# Patient Record
Sex: Male | Born: 1955 | Race: White | Hispanic: No | Marital: Single | Smoking: Former smoker
Health system: Southern US, Community
[De-identification: ages and names within clinical notes are randomized; demographics above are authoritative.]

## PROBLEM LIST (undated history)

## (undated) DIAGNOSIS — I700 Atherosclerosis of aorta: Secondary | ICD-10-CM

## (undated) DIAGNOSIS — I10 Essential (primary) hypertension: Secondary | ICD-10-CM

## (undated) DIAGNOSIS — F1010 Alcohol abuse, uncomplicated: Secondary | ICD-10-CM

## (undated) DIAGNOSIS — M459 Ankylosing spondylitis of unspecified sites in spine: Secondary | ICD-10-CM

## (undated) DIAGNOSIS — I2510 Atherosclerotic heart disease of native coronary artery without angina pectoris: Secondary | ICD-10-CM

## (undated) DIAGNOSIS — N184 Chronic kidney disease, stage 4 (severe): Secondary | ICD-10-CM

## (undated) DIAGNOSIS — D539 Nutritional anemia, unspecified: Secondary | ICD-10-CM

## (undated) HISTORY — PX: TOTAL HIP ARTHROPLASTY: SHX124

## (undated) HISTORY — PX: BACK SURGERY: SHX140

---

## 2019-10-28 ENCOUNTER — Other Ambulatory Visit: Payer: Self-pay

## 2019-10-28 ENCOUNTER — Encounter: Payer: Self-pay | Admitting: Emergency Medicine

## 2019-10-28 DIAGNOSIS — Z96643 Presence of artificial hip joint, bilateral: Secondary | ICD-10-CM | POA: Diagnosis present

## 2019-10-28 DIAGNOSIS — B1920 Unspecified viral hepatitis C without hepatic coma: Secondary | ICD-10-CM | POA: Diagnosis present

## 2019-10-28 DIAGNOSIS — K852 Alcohol induced acute pancreatitis without necrosis or infection: Principal | ICD-10-CM | POA: Diagnosis present

## 2019-10-28 DIAGNOSIS — E669 Obesity, unspecified: Secondary | ICD-10-CM | POA: Diagnosis present

## 2019-10-28 DIAGNOSIS — R1110 Vomiting, unspecified: Secondary | ICD-10-CM | POA: Diagnosis present

## 2019-10-28 DIAGNOSIS — F17210 Nicotine dependence, cigarettes, uncomplicated: Secondary | ICD-10-CM | POA: Diagnosis present

## 2019-10-28 DIAGNOSIS — Z7982 Long term (current) use of aspirin: Secondary | ICD-10-CM

## 2019-10-28 DIAGNOSIS — K8020 Calculus of gallbladder without cholecystitis without obstruction: Secondary | ICD-10-CM | POA: Diagnosis not present

## 2019-10-28 DIAGNOSIS — Z888 Allergy status to other drugs, medicaments and biological substances status: Secondary | ICD-10-CM

## 2019-10-28 DIAGNOSIS — M459 Ankylosing spondylitis of unspecified sites in spine: Secondary | ICD-10-CM | POA: Diagnosis present

## 2019-10-28 DIAGNOSIS — R000 Tachycardia, unspecified: Secondary | ICD-10-CM | POA: Diagnosis present

## 2019-10-28 DIAGNOSIS — Z833 Family history of diabetes mellitus: Secondary | ICD-10-CM

## 2019-10-28 DIAGNOSIS — I10 Essential (primary) hypertension: Secondary | ICD-10-CM | POA: Diagnosis present

## 2019-10-28 DIAGNOSIS — D696 Thrombocytopenia, unspecified: Secondary | ICD-10-CM | POA: Diagnosis present

## 2019-10-28 DIAGNOSIS — Z79891 Long term (current) use of opiate analgesic: Secondary | ICD-10-CM

## 2019-10-28 DIAGNOSIS — I160 Hypertensive urgency: Secondary | ICD-10-CM | POA: Diagnosis present

## 2019-10-28 DIAGNOSIS — Z20828 Contact with and (suspected) exposure to other viral communicable diseases: Secondary | ICD-10-CM | POA: Diagnosis present

## 2019-10-28 DIAGNOSIS — F1020 Alcohol dependence, uncomplicated: Secondary | ICD-10-CM | POA: Diagnosis present

## 2019-10-28 DIAGNOSIS — Z6832 Body mass index (BMI) 32.0-32.9, adult: Secondary | ICD-10-CM

## 2019-10-28 DIAGNOSIS — R739 Hyperglycemia, unspecified: Secondary | ICD-10-CM | POA: Diagnosis present

## 2019-10-28 DIAGNOSIS — N179 Acute kidney failure, unspecified: Secondary | ICD-10-CM | POA: Diagnosis present

## 2019-10-28 DIAGNOSIS — Z79899 Other long term (current) drug therapy: Secondary | ICD-10-CM

## 2019-10-28 LAB — URINALYSIS, COMPLETE (UACMP) WITH MICROSCOPIC
Bacteria, UA: NONE SEEN
Bilirubin Urine: NEGATIVE
Glucose, UA: 50 mg/dL — AB
Hgb urine dipstick: NEGATIVE
Ketones, ur: 5 mg/dL — AB
Leukocytes,Ua: NEGATIVE
Nitrite: NEGATIVE
Protein, ur: >=300 mg/dL — AB
Specific Gravity, Urine: 1.023 (ref 1.005–1.030)
pH: 6 (ref 5.0–8.0)

## 2019-10-28 LAB — CBC WITH DIFFERENTIAL/PLATELET
Abs Immature Granulocytes: 0.1 10*3/uL — ABNORMAL HIGH (ref 0.00–0.07)
Basophils Absolute: 0 10*3/uL (ref 0.0–0.1)
Basophils Relative: 0 %
Eosinophils Absolute: 0 10*3/uL (ref 0.0–0.5)
Eosinophils Relative: 0 %
HCT: 45.1 % (ref 39.0–52.0)
Hemoglobin: 15.4 g/dL (ref 13.0–17.0)
Immature Granulocytes: 1 %
Lymphocytes Relative: 2 %
Lymphs Abs: 0.3 10*3/uL — ABNORMAL LOW (ref 0.7–4.0)
MCH: 33.7 pg (ref 26.0–34.0)
MCHC: 34.1 g/dL (ref 30.0–36.0)
MCV: 98.7 fL (ref 80.0–100.0)
Monocytes Absolute: 0.6 10*3/uL (ref 0.1–1.0)
Monocytes Relative: 4 %
Neutro Abs: 16.2 10*3/uL — ABNORMAL HIGH (ref 1.7–7.7)
Neutrophils Relative %: 93 %
Platelets: 140 10*3/uL — ABNORMAL LOW (ref 150–400)
RBC: 4.57 MIL/uL (ref 4.22–5.81)
RDW: 13.3 % (ref 11.5–15.5)
WBC: 17.3 10*3/uL — ABNORMAL HIGH (ref 4.0–10.5)
nRBC: 0 % (ref 0.0–0.2)

## 2019-10-28 NOTE — ED Triage Notes (Signed)
Pt to triage via w/c, mask in place with no distress noted; pt reports generalized abd pain accomp by N/V; st broke out in sweats earlier; denies hx of same

## 2019-10-29 ENCOUNTER — Inpatient Hospital Stay: Payer: Medicare Other

## 2019-10-29 ENCOUNTER — Inpatient Hospital Stay
Admission: EM | Admit: 2019-10-29 | Discharge: 2019-10-31 | DRG: 439 | Disposition: A | Discharge status: Home / Self Care | Payer: Medicare Other | Attending: Internal Medicine | Admitting: Internal Medicine

## 2019-10-29 ENCOUNTER — Emergency Department: Payer: Medicare Other

## 2019-10-29 ENCOUNTER — Encounter: Payer: Self-pay | Admitting: Emergency Medicine

## 2019-10-29 DIAGNOSIS — K859 Acute pancreatitis without necrosis or infection, unspecified: Secondary | ICD-10-CM | POA: Diagnosis present

## 2019-10-29 DIAGNOSIS — F1010 Alcohol abuse, uncomplicated: Secondary | ICD-10-CM | POA: Diagnosis present

## 2019-10-29 DIAGNOSIS — M459 Ankylosing spondylitis of unspecified sites in spine: Secondary | ICD-10-CM | POA: Diagnosis present

## 2019-10-29 DIAGNOSIS — F10288 Alcohol dependence with other alcohol-induced disorder: Secondary | ICD-10-CM | POA: Diagnosis not present

## 2019-10-29 DIAGNOSIS — Z6832 Body mass index (BMI) 32.0-32.9, adult: Secondary | ICD-10-CM | POA: Diagnosis not present

## 2019-10-29 DIAGNOSIS — E669 Obesity, unspecified: Secondary | ICD-10-CM | POA: Diagnosis present

## 2019-10-29 DIAGNOSIS — I10 Essential (primary) hypertension: Secondary | ICD-10-CM | POA: Diagnosis not present

## 2019-10-29 DIAGNOSIS — Z96643 Presence of artificial hip joint, bilateral: Secondary | ICD-10-CM | POA: Diagnosis present

## 2019-10-29 DIAGNOSIS — R000 Tachycardia, unspecified: Secondary | ICD-10-CM | POA: Diagnosis present

## 2019-10-29 DIAGNOSIS — Z79899 Other long term (current) drug therapy: Secondary | ICD-10-CM | POA: Diagnosis not present

## 2019-10-29 DIAGNOSIS — F1020 Alcohol dependence, uncomplicated: Secondary | ICD-10-CM | POA: Diagnosis present

## 2019-10-29 DIAGNOSIS — K8020 Calculus of gallbladder without cholecystitis without obstruction: Secondary | ICD-10-CM | POA: Diagnosis present

## 2019-10-29 DIAGNOSIS — I160 Hypertensive urgency: Secondary | ICD-10-CM | POA: Diagnosis present

## 2019-10-29 DIAGNOSIS — D696 Thrombocytopenia, unspecified: Secondary | ICD-10-CM | POA: Diagnosis present

## 2019-10-29 DIAGNOSIS — Z20828 Contact with and (suspected) exposure to other viral communicable diseases: Secondary | ICD-10-CM | POA: Diagnosis present

## 2019-10-29 DIAGNOSIS — R739 Hyperglycemia, unspecified: Secondary | ICD-10-CM | POA: Diagnosis present

## 2019-10-29 DIAGNOSIS — Z720 Tobacco use: Secondary | ICD-10-CM | POA: Diagnosis present

## 2019-10-29 DIAGNOSIS — Z833 Family history of diabetes mellitus: Secondary | ICD-10-CM | POA: Diagnosis not present

## 2019-10-29 DIAGNOSIS — N179 Acute kidney failure, unspecified: Secondary | ICD-10-CM | POA: Diagnosis present

## 2019-10-29 DIAGNOSIS — Z7982 Long term (current) use of aspirin: Secondary | ICD-10-CM | POA: Diagnosis not present

## 2019-10-29 DIAGNOSIS — Z888 Allergy status to other drugs, medicaments and biological substances status: Secondary | ICD-10-CM | POA: Diagnosis not present

## 2019-10-29 DIAGNOSIS — K852 Alcohol induced acute pancreatitis without necrosis or infection: Secondary | ICD-10-CM

## 2019-10-29 DIAGNOSIS — Z79891 Long term (current) use of opiate analgesic: Secondary | ICD-10-CM | POA: Diagnosis not present

## 2019-10-29 DIAGNOSIS — B1920 Unspecified viral hepatitis C without hepatic coma: Secondary | ICD-10-CM | POA: Diagnosis present

## 2019-10-29 DIAGNOSIS — F17210 Nicotine dependence, cigarettes, uncomplicated: Secondary | ICD-10-CM | POA: Diagnosis present

## 2019-10-29 DIAGNOSIS — R1110 Vomiting, unspecified: Secondary | ICD-10-CM | POA: Diagnosis present

## 2019-10-29 HISTORY — DX: Essential (primary) hypertension: I10

## 2019-10-29 HISTORY — DX: Alcohol abuse, uncomplicated: F10.10

## 2019-10-29 HISTORY — DX: Ankylosing spondylitis of unspecified sites in spine: M45.9

## 2019-10-29 LAB — HEPATIC FUNCTION PANEL
ALT: 20 U/L (ref 0–44)
AST: 36 U/L (ref 15–41)
Albumin: 3.4 g/dL — ABNORMAL LOW (ref 3.5–5.0)
Alkaline Phosphatase: 76 U/L (ref 38–126)
Bilirubin, Direct: 0.2 mg/dL (ref 0.0–0.2)
Indirect Bilirubin: 0.7 mg/dL (ref 0.3–0.9)
Total Bilirubin: 0.9 mg/dL (ref 0.3–1.2)
Total Protein: 7.2 g/dL (ref 6.5–8.1)

## 2019-10-29 LAB — BASIC METABOLIC PANEL
Anion gap: 9 (ref 5–15)
BUN: 15 mg/dL (ref 8–23)
CO2: 23 mmol/L (ref 22–32)
Calcium: 8.4 mg/dL — ABNORMAL LOW (ref 8.9–10.3)
Chloride: 105 mmol/L (ref 98–111)
Creatinine, Ser: 1.08 mg/dL (ref 0.61–1.24)
GFR calc Af Amer: >60 mL/min (ref >60)
GFR calc non Af Amer: >60 mL/min (ref >60)
Glucose, Bld: 180 mg/dL — ABNORMAL HIGH (ref 70–99)
Potassium: 4.1 mmol/L (ref 3.5–5.1)
Sodium: 137 mmol/L (ref 135–145)

## 2019-10-29 LAB — COMPREHENSIVE METABOLIC PANEL
ALT: 22 U/L (ref 0–44)
AST: 35 U/L (ref 15–41)
Albumin: 3.7 g/dL (ref 3.5–5.0)
Alkaline Phosphatase: 83 U/L (ref 38–126)
Anion gap: 14 (ref 5–15)
BUN: 15 mg/dL (ref 8–23)
CO2: 20 mmol/L — ABNORMAL LOW (ref 22–32)
Calcium: 9.1 mg/dL (ref 8.9–10.3)
Chloride: 103 mmol/L (ref 98–111)
Creatinine, Ser: 1.15 mg/dL (ref 0.61–1.24)
GFR calc Af Amer: >60 mL/min (ref >60)
GFR calc non Af Amer: >60 mL/min (ref >60)
Glucose, Bld: 183 mg/dL — ABNORMAL HIGH (ref 70–99)
Potassium: 4.2 mmol/L (ref 3.5–5.1)
Sodium: 137 mmol/L (ref 135–145)
Total Bilirubin: 0.8 mg/dL (ref 0.3–1.2)
Total Protein: 7.7 g/dL (ref 6.5–8.1)

## 2019-10-29 LAB — CBC WITH DIFFERENTIAL/PLATELET
Abs Immature Granulocytes: 0.1 10*3/uL — ABNORMAL HIGH (ref 0.00–0.07)
Basophils Absolute: 0 10*3/uL (ref 0.0–0.1)
Basophils Relative: 0 %
Eosinophils Absolute: 0 10*3/uL (ref 0.0–0.5)
Eosinophils Relative: 0 %
HCT: 42.6 % (ref 39.0–52.0)
Hemoglobin: 15.3 g/dL (ref 13.0–17.0)
Immature Granulocytes: 1 %
Lymphocytes Relative: 1 %
Lymphs Abs: 0.2 10*3/uL — ABNORMAL LOW (ref 0.7–4.0)
MCH: 34.5 pg — ABNORMAL HIGH (ref 26.0–34.0)
MCHC: 35.9 g/dL (ref 30.0–36.0)
MCV: 95.9 fL (ref 80.0–100.0)
Monocytes Absolute: 1 10*3/uL (ref 0.1–1.0)
Monocytes Relative: 5 %
Neutro Abs: 17 10*3/uL — ABNORMAL HIGH (ref 1.7–7.7)
Neutrophils Relative %: 93 %
Platelets: 128 10*3/uL — ABNORMAL LOW (ref 150–400)
RBC: 4.44 MIL/uL (ref 4.22–5.81)
RDW: 13 % (ref 11.5–15.5)
WBC: 18.3 10*3/uL — ABNORMAL HIGH (ref 4.0–10.5)
nRBC: 0 % (ref 0.0–0.2)

## 2019-10-29 LAB — TROPONIN I (HIGH SENSITIVITY)
Troponin I (High Sensitivity): 14 ng/L (ref <18)
Troponin I (High Sensitivity): 30 ng/L — ABNORMAL HIGH (ref <18)
Troponin I (High Sensitivity): 33 ng/L — ABNORMAL HIGH (ref <18)

## 2019-10-29 LAB — GLUCOSE, CAPILLARY
Glucose-Capillary: 161 mg/dL — ABNORMAL HIGH (ref 70–99)
Glucose-Capillary: 145 mg/dL — ABNORMAL HIGH (ref 70–99)
Glucose-Capillary: 134 mg/dL — ABNORMAL HIGH (ref 70–99)

## 2019-10-29 LAB — ETHANOL: Alcohol, Ethyl (B): <10 mg/dL (ref <10)

## 2019-10-29 LAB — SARS CORONAVIRUS 2 (TAT 6-24 HRS): SARS Coronavirus 2: NEGATIVE

## 2019-10-29 LAB — LIPASE, BLOOD: Lipase: 1454 U/L — ABNORMAL HIGH (ref 11–51)

## 2019-10-29 MED ORDER — ACETAMINOPHEN 650 MG RE SUPP: 650.0000 mg | Freq: Four times a day (QID) | RECTAL | Status: DC | PRN

## 2019-10-29 MED ORDER — LABETALOL HCL 5 MG/ML IV SOLN
10.0000 mg | INTRAVENOUS | Status: DC | PRN
  Administered 2019-10-29 – 2019-10-30 (×4): 10 mg via INTRAVENOUS
  Filled 2019-10-29 (×6): qty 4

## 2019-10-29 MED ORDER — ADULT MULTIVITAMIN W/MINERALS CH
1.0000 | ORAL_TABLET | Freq: Every day | ORAL | Status: DC
  Administered 2019-10-29 – 2019-10-31 (×3): 1 via ORAL
  Filled 2019-10-29 (×3): qty 1

## 2019-10-29 MED ORDER — LACTATED RINGERS IV SOLN
INTRAVENOUS | Status: AC
  Administered 2019-10-29 (×2): via INTRAVENOUS

## 2019-10-29 MED ORDER — ONDANSETRON HCL 4 MG/2ML IJ SOLN
4.0000 mg | Freq: Four times a day (QID) | INTRAMUSCULAR | Status: DC | PRN
  Administered 2019-10-29: 4 mg via INTRAVENOUS
  Filled 2019-10-29: qty 2

## 2019-10-29 MED ORDER — LORAZEPAM 2 MG/ML IJ SOLN
1.0000 mg | INTRAMUSCULAR | Status: DC | PRN
  Administered 2019-10-31: 1 mg via INTRAVENOUS
  Filled 2019-10-29: qty 1

## 2019-10-29 MED ORDER — LORAZEPAM 2 MG PO TABS: 0.0000 mg | ORAL_TABLET | Freq: Two times a day (BID) | ORAL | Status: DC

## 2019-10-29 MED ORDER — LORAZEPAM 2 MG PO TABS: 0.0000 mg | ORAL_TABLET | Freq: Four times a day (QID) | ORAL | Status: DC

## 2019-10-29 MED ORDER — ONDANSETRON HCL 4 MG PO TABS: 4.0000 mg | ORAL_TABLET | Freq: Four times a day (QID) | ORAL | Status: DC | PRN

## 2019-10-29 MED ORDER — LORAZEPAM 2 MG/ML IJ SOLN: 0.0000 mg | Freq: Two times a day (BID) | INTRAMUSCULAR | Status: DC

## 2019-10-29 MED ORDER — LORAZEPAM 2 MG/ML IJ SOLN: 0.0000 mg | Freq: Four times a day (QID) | INTRAMUSCULAR | Status: AC

## 2019-10-29 MED ORDER — SODIUM CHLORIDE 0.9 % IV BOLUS
1000.0000 mL | Freq: Once | INTRAVENOUS | Status: AC
  Administered 2019-10-29: 1000 mL via INTRAVENOUS

## 2019-10-29 MED ORDER — LORAZEPAM 1 MG PO TABS
1.0000 mg | ORAL_TABLET | ORAL | Status: DC | PRN
  Administered 2019-10-29: 1 mg via ORAL
  Filled 2019-10-29: qty 1

## 2019-10-29 MED ORDER — SULFASALAZINE 500 MG PO TBEC
500.0000 mg | DELAYED_RELEASE_TABLET | Freq: Two times a day (BID) | ORAL | Status: DC
  Administered 2019-10-29 – 2019-10-31 (×5): 500 mg via ORAL
  Filled 2019-10-29 (×7): qty 1

## 2019-10-29 MED ORDER — IOHEXOL 300 MG/ML  SOLN
100.0000 mL | Freq: Once | INTRAMUSCULAR | Status: AC | PRN
  Administered 2019-10-29: 100 mL via INTRAVENOUS

## 2019-10-29 MED ORDER — LORAZEPAM 2 MG/ML IJ SOLN: 0.0000 mg | Freq: Four times a day (QID) | INTRAMUSCULAR | Status: DC

## 2019-10-29 MED ORDER — ONDANSETRON HCL 4 MG/2ML IJ SOLN
4.0000 mg | INTRAMUSCULAR | Status: AC
  Administered 2019-10-29: 4 mg via INTRAVENOUS
  Filled 2019-10-29: qty 2

## 2019-10-29 MED ORDER — HYDROMORPHONE HCL 1 MG/ML IJ SOLN
1.0000 mg | INTRAMUSCULAR | Status: AC
  Administered 2019-10-29: 1 mg via INTRAVENOUS
  Filled 2019-10-29: qty 1

## 2019-10-29 MED ORDER — HYDROMORPHONE HCL 1 MG/ML IJ SOLN
1.0000 mg | INTRAMUSCULAR | Status: DC | PRN
  Administered 2019-10-29 – 2019-10-30 (×8): 1 mg via INTRAVENOUS
  Filled 2019-10-29 (×8): qty 1

## 2019-10-29 MED ORDER — FOLIC ACID 1 MG PO TABS
1.0000 mg | ORAL_TABLET | Freq: Every day | ORAL | Status: DC
  Administered 2019-10-29 – 2019-10-31 (×3): 1 mg via ORAL
  Filled 2019-10-29 (×3): qty 1

## 2019-10-29 MED ORDER — ACETAMINOPHEN 325 MG PO TABS: 650.0000 mg | ORAL_TABLET | Freq: Four times a day (QID) | ORAL | Status: DC | PRN

## 2019-10-29 MED ORDER — NICOTINE 14 MG/24HR TD PT24
14.0000 mg | MEDICATED_PATCH | Freq: Every day | TRANSDERMAL | Status: DC
  Administered 2019-10-29 – 2019-10-31 (×3): 14 mg via TRANSDERMAL
  Filled 2019-10-29 (×3): qty 1

## 2019-10-29 MED ORDER — LABETALOL HCL 5 MG/ML IV SOLN
10.0000 mg | Freq: Once | INTRAVENOUS | Status: AC
  Administered 2019-10-29: 10 mg via INTRAVENOUS

## 2019-10-29 MED ORDER — HEPARIN SODIUM (PORCINE) 5000 UNIT/ML IJ SOLN
5000.0000 [IU] | Freq: Three times a day (TID) | INTRAMUSCULAR | Status: DC
  Administered 2019-10-29 – 2019-10-31 (×7): 5000 [IU] via SUBCUTANEOUS
  Filled 2019-10-29 (×8): qty 1

## 2019-10-29 MED ORDER — MORPHINE SULFATE (PF) 4 MG/ML IV SOLN: 4.0000 mg | Freq: Once | INTRAVENOUS | Status: DC

## 2019-10-29 MED ORDER — THIAMINE HCL 100 MG/ML IJ SOLN
100.0000 mg | Freq: Every day | INTRAMUSCULAR | Status: DC
  Administered 2019-10-29: 100 mg via INTRAVENOUS
  Filled 2019-10-29: qty 2

## 2019-10-29 MED ORDER — VITAMIN B-1 100 MG PO TABS
100.0000 mg | ORAL_TABLET | Freq: Every day | ORAL | Status: DC
  Administered 2019-10-29 – 2019-10-31 (×3): 100 mg via ORAL
  Filled 2019-10-29 (×3): qty 1

## 2019-10-29 MED ORDER — THIAMINE HCL 100 MG/ML IJ SOLN
100.0000 mg | Freq: Every day | INTRAMUSCULAR | Status: DC
  Filled 2019-10-29: qty 2

## 2019-10-29 NOTE — H&P (Signed)
History and Physical    Icarus Partch Quilter IOE:[id number] DOB: April 07, 1956 DOA: 10/29/2019  PCP: System, Pcp Not In  Patient coming from: Home.  Chief Complaint: Abdominal pain.  HPI: Victor Willis is a 63 y.o. male with history of hypertension, alkalizing spondylitis, alcohol abuse presents to the ER with complaints of worsening abdominal pain since last afternoon.  Pain is mostly in the epigastric area going across the upper part of the abdomen.  Has had multiple episodes of vomiting.  Denies any diarrhea denies any blood in the vomitus.  Denies any chest pain or shortness of breath fever or chills.  Admits to drinking alcohol every day.  ED Course: In the ER patient was tachycardic hypertensive afebrile.  Labs show glucose of 183 creatinine 1.1 lipase 1454 and AST ALT were normal.  Bilirubin was normal.  WBC 17.3 high-sensitivity troponin was 14.  COVID-19 test was pending.  UA unremarkable.  EKG shows normal sinus rhythm.  Cannot shows features concerning for acute pancreatitis also seen is cholelithiasis.  Patient was started on IV fluid bolus pain relief medications and CIWA protocol.  Review of Systems: As per HPI, rest all negative.   Past Medical History:  Diagnosis Date  . Alcohol abuse   . Ankylosing spondylitis ([place])   . Hypertension     Past Surgical History:  Procedure Laterality Date  . BACK SURGERY    . TOTAL HIP ARTHROPLASTY Bilateral      reports that he has been smoking. He has never used smokeless tobacco. He reports current alcohol use. No history on file for drug.  Allergies  Allergen Reactions  . Celebrex [Celecoxib] Rash    Family History  Problem Relation Age of Onset  . Diabetes Mellitus II Mother   . Diabetes Mellitus II Father   . Ankylosing spondylitis Daughter     Prior to Admission medications   Medication Sig Start Date End Date Taking? Authorizing Provider  aspirin EC 81 MG tablet Take 81 mg by mouth every other day.    Yes  [provider]  cyclobenzaprine (FLEXERIL) 10 MG tablet Take 10 mg by mouth 2 (two) times daily. 09/19/19  Yes [provider]  lisinopril (ZESTRIL) 40 MG tablet Take 40 mg by mouth daily. 10/08/19  Yes [provider]  Multiple Vitamins-Minerals (MULTIVITAMIN GUMMIES ADULT PO) Take 2 each by mouth daily.   Yes [provider]  oxycodone (ROXICODONE) 30 MG immediate release tablet Take 30 mg by mouth 5 (five) times daily. 10/23/19  Yes [provider]  sulfaSALAzine (AZULFIDINE) 500 MG tablet Take 500 mg by mouth 2 (two) times daily. 09/19/19  Yes [provider]    Physical Exam: Constitutional: Moderately built and nourished. Vitals:   10/28/19 2308 10/29/19 0300 10/29/19 0354 10/29/19 0400  BP: (!) 208/102 (!) 199/104 (!) 186/110 (!) 189/110  Pulse: 91 (!) 101 (!) 117 (!) 117  Resp: (!) 28     Temp: 98.2 F (36.8 C)     TempSrc: Oral     SpO2: 96% 96%  93%  Weight:      Height:       Eyes: Anicteric no pallor. ENMT: No discharge from the ears eyes nose or mouth. Neck: No mass or.  No neck rigidity. Respiratory: No rhonchi or crepitations. Cardiovascular: S1-S2 heard. Abdomen: Mild epigastric tenderness no guarding or rigidity. Musculoskeletal: No edema. Skin: No rash. Neurologic: Alert awake oriented to time place and person.  Moves all extremities. Psychiatric: Appears normal per  normal affect.   Labs on Admission: I have personally reviewed following labs and imaging studies  CBC: Recent Labs  Lab 10/28/19 2316  WBC 17.3*  NEUTROABS 16.2*  HGB 15.4  HCT 45.1  MCV 98.7  PLT 140*   Basic Metabolic Panel: Recent Labs  Lab 10/28/19 2316  NA 137  K 4.2  CL 103  CO2 20*  GLUCOSE 183*  BUN 15  CREATININE 1.15  CALCIUM 9.1   GFR: Estimated Creatinine Clearance: 58.6 mL/min (by C-G formula based on SCr of 1.15 mg/dL). Liver Function Tests: Recent Labs  Lab 10/28/19 2316  AST 35  ALT 22  ALKPHOS 83   BILITOT 0.8  PROT 7.7  ALBUMIN 3.7   Recent Labs  Lab 10/28/19 2316  LIPASE 1,454*   No results for input(s): AMMONIA in the last 168 hours. Coagulation Profile: No results for input(s): INR, PROTIME in the last 168 hours. Cardiac Enzymes: No results for input(s): CKTOTAL, CKMB, CKMBINDEX, TROPONINI in the last 168 hours. BNP (last 3 results) No results for input(s): PROBNP in the last 8760 hours. HbA1C: No results for input(s): HGBA1C in the last 72 hours. CBG: No results for input(s): GLUCAP in the last 168 hours. Lipid Profile: No results for input(s): CHOL, HDL, LDLCALC, TRIG, CHOLHDL, LDLDIRECT in the last 72 hours. Thyroid Function Tests: No results for input(s): TSH, T4TOTAL, FREET4, T3FREE, THYROIDAB in the last 72 hours. Anemia Panel: No results for input(s): VITAMINB12, FOLATE, FERRITIN, TIBC, IRON, RETICCTPCT in the last 72 hours. Urine analysis:    Component Value Date/Time   COLORURINE YELLOW (A) 10/28/2019 2317   APPEARANCEUR CLEAR (A) 10/28/2019 2317   LABSPEC 1.023 10/28/2019 2317   PHURINE 6.0 10/28/2019 2317   GLUCOSEU 50 (A) 10/28/2019 2317   HGBUR NEGATIVE 10/28/2019 2317   BILIRUBINUR NEGATIVE 10/28/2019 2317   KETONESUR 5 (A) 10/28/2019 2317   PROTEINUR >=300 (A) 10/28/2019 2317   NITRITE NEGATIVE 10/28/2019 2317   LEUKOCYTESUR NEGATIVE 10/28/2019 2317   Sepsis Labs: @LABRCNTIP (procalcitonin:4,lacticidven:4) )No results found for this or any previous visit (from the past 240 hour(s)).   Radiological Exams on Admission: Ct Abd/pelvis For Sbo (iv Contrast Only)  Result Date: 10/29/2019 CLINICAL DATA:  Nausea, vomiting. EXAM: CT ABDOMEN AND PELVIS WITH CONTRAST TECHNIQUE: Multidetector CT imaging of the abdomen and pelvis was performed using the standard protocol following bolus administration of intravenous contrast. CONTRAST:  10/31/2019 OMNIPAQUE IOHEXOL 300 MG/ML  SOLN COMPARISON:  None. FINDINGS: Lower chest: Bibasilar atelectasis. Coronary artery  calcifications. Heart is borderline in size. Hepatobiliary: No focal hepatic abnormality. 11 mm gallstone layering within the gallbladder. Pancreas: Calcifications within the pancreatic head compatible with chronic pancreatitis. There is inflammation around the pancreatic head and uncinate process compatible with acute pancreatitis. Pancreatic duct is mildly prominent at 5 mm. Spleen: No focal abnormality.  Normal size. Adrenals/Urinary Tract: Bilateral renal cysts. No hydronephrosis. Adrenal glands unremarkable. Urinary bladder obscured by beam hardening artifact from bilateral hip replacements. Stomach/Bowel: Scattered left colonic diverticulosis. No active diverticulitis. Stomach and small bowel decompressed, unremarkable. Vascular/Lymphatic: Heavily calcified aorta and iliac vessels. No evidence of aneurysm or adenopathy. Reproductive: Lower pelvis obscured by beam hardening artifact. Other: No free fluid or free air. Musculoskeletal: Bilateral hip replacements. No acute bony abnormality. IMPRESSION: Inflammation around the pancreatic head, uncinate process and duodenal sweep most compatible with acute pancreatitis. Calcifications within the pancreatic head compatible with chronic pancreatitis. Bilateral renal cysts. Scattered left colonic diverticulosis.  No active diverticulitis. Aortic atherosclerosis. Cholelithiasis. Electronically Signed   By:  Charlett NoseKevin  Dover M.D.   On: 10/29/2019 03:37    EKG: Independently reviewed.  Normal sinus rhythm.  Assessment/Plan Principal Problem:   Acute pancreatitis Active Problems:   Ankylosing spondylitis ([facility])   Essential hypertension   Alcohol abuse   Hypertensive urgency    1. Acute pancreatitis likely alcohol related.  CT scan does show cholelithiasis but no definite evidence of any CBD duct stones.  LFTs were normal.  Will follow LFTs keep patient n.p.o. continue with aggressive hydration and if LFTs worsen may need to check MRCP.  CT scan also shows  calcifications concerning for chronic pancreatitis. 2. Alcohol abuse on CIWA protocol.  Advised about quitting. 3. Hypertensive urgency likely due to pain related to acute pancreatitis.  As needed IV labetalol for now.  Once patient can tolerate orally start on lisinopril if creatinine is normal. 4. 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 5. Mild hyperglycemia if still persist check hemoglobin A1c. 6. Mild thrombocytopenia could be related to alcoholism.  Follow CBC.  COVID-19 test is pending.  Given that patient has acute pancreatitis with hypertensive urgency will need more than 2 midnight stay in inpatient status.   DVT prophylaxis: Heparin. Code Status: Full code. Family Communication: Patient's wife at bedside. Disposition Plan: Home. Consults called: None. Admission status: Inpatient.   Eduard ClosArshad N Nymir Ringler MD [facility] Pager [phone].  If 7PM-7AM, please contact night-coverage [email or web address] Password [email or web address]  10/29/2019, 4:33 AM

## 2019-10-29 NOTE — Progress Notes (Signed)
Patient seen and examined, admitted this morning by Dr. Raliegh Ip, H&P reviewed and agree with the assessment and plan.  In brief, this is a 63 year old male with hypertension, ankylosing spondylitis, alcohol abuse who came into the hospital with worsening abdominal pain, nausea vomiting since last night, pain is in the epigastric area radiating into his back.  He denies similar symptoms in the past but does state that he has been drinking alcohol daily.  He has cut down significantly from, she used to drink in the past but still has 2 vodkas every day.  He mainly drinks because he is afraid that he will go into severe withdrawals.   Acute pancreatitis related to alcohol -CT scan does show cholelithiasis however LFTs are normal and there is no definite evidence of CBD duct stone.  Will obtain right upper quadrant ultrasound to better characterize.  LFTs are stable.  CT scan also showed calcification concerning for chronic pancreatitis which is consistent with his alcohol use -We will keep n.p.o. for the next 24 hours, and if improved by tomorrow will slowly advance diet, patient inquiring about when could he go home and the earliest possible time would be 11/19 in the afternoon  Alcohol use -Continue CIWA for alcohol abuse  Hypertension -Continue IV labetalol   Scheduled Meds: . folic acid  1 mg Oral Daily  . heparin  5,000 Units Subcutaneous Q8H  . LORazepam  0-4 mg Intravenous Q6H   Followed by  . [START ON 10/31/2019] LORazepam  0-4 mg Intravenous Q12H  . multivitamin with minerals  1 tablet Oral Daily  . nicotine  14 mg Transdermal Daily  . sulfaSALAzine  500 mg Oral BID  . thiamine  100 mg Oral Daily   Or  . thiamine  100 mg Intravenous Daily   Continuous Infusions: . lactated ringers 200 mL/hr at 10/29/19 0643   PRN Meds:.acetaminophen **OR** acetaminophen, HYDROmorphone (DILAUDID) injection, labetalol, LORazepam **OR** LORazepam, ondansetron **OR** ondansetron (ZOFRAN) IV  Costin M.  Cruzita Lederer, MD, PhD [facility]

## 2019-10-29 NOTE — ED Notes (Signed)
Patient in CT

## 2019-10-29 NOTE — ED Provider Notes (Signed)
[facility] Emergency Department Provider Note  ____________________________________________   First MD Initiated Contact with Patient 10/29/19 [phone]     (approximate)  I have reviewed the triage vital signs and the nursing notes.   HISTORY  Chief Complaint Abdominal Pain    HPI Victor Willis is a 63 y.o. male who admits to chronic alcohol use and having withdrawal symptoms if he does not drink regularly.  He presents by private vehicle tonight for evaluation of acute onset and severe generalized abdominal pain associated with nausea and vomiting.  The pain is all throughout his abdomen as well as radiating into the right side of his back.  He has no dysuria.  He has never had similar symptoms in the past.  He has not had a cholecystectomy.  The symptoms started rather acutely tonight.  He says that he recently was told that his hepatitis C was getting better so he celebrated with a larger amount of alcohol than usual although he says he has been trying to cut back recently.  He says he has to drink at least 2 drinks per day to avoid getting the shakes.  He drank some prior to coming to the emergency department to avoid having the shakes while he was here.  No history of seizures or DTs.  His abdominal pain is severe and nothing in particular makes it better or worse.  No recent contact with COVID-19 patients.  He denies fever/chills, chest pain, shortness of breath, cough.         Past Medical History:  Diagnosis Date   Alcohol abuse    Ankylosing spondylitis (HCC)    Hypertension     Patient Active Problem List   Diagnosis Date Noted   Ankylosing spondylitis (HCC) 10/29/2019   Essential hypertension 10/29/2019   Acute pancreatitis 10/29/2019   Alcohol abuse 10/29/2019   Hypertensive urgency 10/29/2019    Past Surgical History:  Procedure Laterality Date   BACK SURGERY     TOTAL HIP ARTHROPLASTY Bilateral     Prior to Admission  medications   Medication Sig Start Date End Date Taking? Authorizing Provider  aspirin EC 81 MG tablet Take 81 mg by mouth every other day.    Yes [provider]  cyclobenzaprine (FLEXERIL) 10 MG tablet Take 10 mg by mouth 2 (two) times daily. 09/19/19  Yes [provider]  lisinopril (ZESTRIL) 40 MG tablet Take 40 mg by mouth daily. 10/08/19  Yes [provider]  Multiple Vitamins-Minerals (MULTIVITAMIN GUMMIES ADULT PO) Take 2 each by mouth daily.   Yes [provider]  oxycodone (ROXICODONE) 30 MG immediate release tablet Take 30 mg by mouth 5 (five) times daily. 10/23/19  Yes [provider]  sulfaSALAzine (AZULFIDINE) 500 MG tablet Take 500 mg by mouth 2 (two) times daily. 09/19/19  Yes [provider]    Allergies Celebrex [celecoxib]  Family History  Problem Relation Age of Onset   Diabetes Mellitus II Mother    Diabetes Mellitus II Father    Ankylosing spondylitis Daughter     Social History Social History   Tobacco Use   Smoking status: Current Every Day Smoker   Smokeless tobacco: Never Used  Substance Use Topics   Alcohol use: Yes    Comment: daily, heavy   Drug use: Not on file    Review of Systems Constitutional: No fever/chills Eyes: No visual changes. ENT: No sore throat. Cardiovascular: Denies chest pain. Respiratory: Denies shortness of breath. Gastrointestinal: Acute  onset severe abdominal pain with nausea and vomiting. Genitourinary: Negative for dysuria. Musculoskeletal: Negative for neck pain.  Negative for back pain. Integumentary: Negative for rash. Neurological: Negative for headaches, focal weakness or numbness. Psych:  alcohol dependence   ____________________________________________   PHYSICAL EXAM:  VITAL SIGNS: ED Triage Vitals  Enc Vitals Group     BP 10/28/19 2308 (!) 208/102     Pulse Rate 10/28/19 2308 91     Resp 10/28/19 2308 (!) 28     Temp 10/28/19 2308 98.2 F  (36.8 C)     Temp Source 10/28/19 2308 Oral     SpO2 10/28/19 2308 96 %     Weight 10/28/19 2306 77.1 kg (170 lb)     Height 10/28/19 2306 1.549 m (5\' 1" )     Head Circumference --      Peak Flow --      Pain Score 10/28/19 2306 8     Pain Loc --      Pain Edu? --      Excl. in [place]? --     Constitutional: Alert and oriented.  Appears uncomfortable and to be in pain. Eyes: Conjunctivae are normal.  Head: Atraumatic. Nose: No congestion/rhinnorhea. Mouth/Throat: Patient is wearing a mask. Neck: No stridor.  No meningeal signs.   Cardiovascular: Normal rate, regular rhythm. Good peripheral circulation. Grossly normal heart sounds. Respiratory: Normal respiratory effort.  No retractions. Gastrointestinal: Soft and mildly distended although this could be his baseline.  Tenderness to palpation all throughout the abdomen but without any specific focal tenderness.  Tenderness does seem to be a little bit worse in the upper abdomen. Musculoskeletal: No lower extremity tenderness nor edema. No gross deformities of extremities.   Neurologic:  Normal speech and language. No gross focal neurologic deficits are appreciated.  No tremor at this time. Skin:  Skin is warm, dry and intact. Psychiatric: Mood and affect are normal. Speech and behavior are normal.  ____________________________________________   LABS (all labs ordered are listed, but only abnormal results are displayed)  Labs Reviewed  CBC WITH DIFFERENTIAL/PLATELET - Abnormal; Notable for the following components:      Result Value   WBC 17.3 (*)    Platelets 140 (*)    Neutro Abs 16.2 (*)    Lymphs Abs 0.3 (*)    Abs Immature Granulocytes 0.10 (*)    All other components within normal limits  COMPREHENSIVE METABOLIC PANEL - Abnormal; Notable for the following components:   CO2 20 (*)    Glucose, Bld 183 (*)    All other components within normal limits  LIPASE, BLOOD - Abnormal; Notable for the following components:   Lipase  1,454 (*)    All other components within normal limits  URINALYSIS, COMPLETE (UACMP) WITH MICROSCOPIC - Abnormal; Notable for the following components:   Color, Urine YELLOW (*)    APPearance CLEAR (*)    Glucose, UA 50 (*)    Ketones, ur 5 (*)    Protein, ur >=300 (*)    All other components within normal limits  HEPATIC FUNCTION PANEL - Abnormal; Notable for the following components:   Albumin 3.4 (*)    All other components within normal limits  BASIC METABOLIC PANEL - Abnormal; Notable for the following components:   Glucose, Bld 180 (*)    Calcium 8.4 (*)    All other components within normal limits  CBC WITH DIFFERENTIAL/PLATELET - Abnormal; Notable for the following components:   WBC 18.3 (*)  MCH 34.5 (*)    Platelets 128 (*)    Neutro Abs 17.0 (*)    Lymphs Abs 0.2 (*)    Abs Immature Granulocytes 0.10 (*)    All other components within normal limits  GLUCOSE, CAPILLARY - Abnormal; Notable for the following components:   Glucose-Capillary 161 (*)    All other components within normal limits  TROPONIN I (HIGH SENSITIVITY) - Abnormal; Notable for the following components:   Troponin I (High Sensitivity) 30 (*)    All other components within normal limits  SARS CORONAVIRUS 2 (TAT 6-24 HRS)  ETHANOL  TROPONIN I (HIGH SENSITIVITY)  TROPONIN I (HIGH SENSITIVITY)   ____________________________________________  EKG  ED ECG REPORT I, Loleta Roseory Drew Herman, the attending physician, personally viewed and interpreted this ECG.  Date: 10/28/2019 EKG Time: 23: 13 Rate: 93 Rhythm: normal sinus rhythm QRS Axis: normal Intervals: normal ST/T Wave abnormalities: normal Narrative Interpretation: no evidence of acute ischemia  ____________________________________________  RADIOLOGY I, Loleta Roseory Joi Leyva, personally viewed and evaluated these images (plain radiographs) as part of my medical decision making, as well as reviewing the written report by the radiologist.  ED MD  interpretation:  Evidence of pancreatitis without obvious complication  Official radiology report(s): Ct Abd/pelvis For Sbo (iv Contrast Only)  Result Date: 10/29/2019 CLINICAL DATA:  Nausea, vomiting. EXAM: CT ABDOMEN AND PELVIS WITH CONTRAST TECHNIQUE: Multidetector CT imaging of the abdomen and pelvis was performed using the standard protocol following bolus administration of intravenous contrast. CONTRAST:  100mL OMNIPAQUE IOHEXOL 300 MG/ML  SOLN COMPARISON:  None. FINDINGS: Lower chest: Bibasilar atelectasis. Coronary artery calcifications. Heart is borderline in size. Hepatobiliary: No focal hepatic abnormality. 11 mm gallstone layering within the gallbladder. Pancreas: Calcifications within the pancreatic head compatible with chronic pancreatitis. There is inflammation around the pancreatic head and uncinate process compatible with acute pancreatitis. Pancreatic duct is mildly prominent at 5 mm. Spleen: No focal abnormality.  Normal size. Adrenals/Urinary Tract: Bilateral renal cysts. No hydronephrosis. Adrenal glands unremarkable. Urinary bladder obscured by beam hardening artifact from bilateral hip replacements. Stomach/Bowel: Scattered left colonic diverticulosis. No active diverticulitis. Stomach and small bowel decompressed, unremarkable. Vascular/Lymphatic: Heavily calcified aorta and iliac vessels. No evidence of aneurysm or adenopathy. Reproductive: Lower pelvis obscured by beam hardening artifact. Other: No free fluid or free air. Musculoskeletal: Bilateral hip replacements. No acute bony abnormality. IMPRESSION: Inflammation around the pancreatic head, uncinate process and duodenal sweep most compatible with acute pancreatitis. Calcifications within the pancreatic head compatible with chronic pancreatitis. Bilateral renal cysts. Scattered left colonic diverticulosis.  No active diverticulitis. Aortic atherosclerosis. Cholelithiasis. Electronically Signed   By: Charlett NoseKevin  Dover M.D.   On:  10/29/2019 03:37    ____________________________________________   PROCEDURES   Procedure(s) performed (including Critical Care):  Procedures   ____________________________________________   INITIAL IMPRESSION / MDM / ASSESSMENT AND PLAN / ED COURSE  As part of my medical decision making, I reviewed the following data within the electronic MEDICAL RECORD NUMBER History obtained from family, Nursing notes reviewed and incorporated, Labs reviewed , EKG interpreted , Old chart reviewed, Discussed with admitting physician  and Notes from prior ED visits   Differential diagnosis includes, but is not limited to, acute pancreatitis, biliary obstruction, neoplasm, alcohol withdrawal.  The patient has no clinical findings suggestive of alcohol withdrawal at this time.  His physical exam is nonfocal but he does have substantial tenderness to palpation throughout the abdomen.  His lab work is notable for a severely elevated lipase of greater than 1400.  He  also has a leukocytosis of greater than 17.  Comprehensive metabolic panel is reassuring with no elevated LFTs and no elevated bilirubin and a normal anion gap.  Alcohol level is undetectable.  Given the patient's chronic alcohol abuse I think this is almost certainly alcohol induced pancreatitis.  This is a first presentation I will obtain a CT scan for further evaluation to make sure there is no evidence of pseudocyst or other complication of acute pancreatitis.  Biliary disease is very unlikely and if it is necessary to obtain an ultrasound we can do so but I think a CT scan will be more helpful at this time.  I explained to the patient his diagnosis and plan for admission after imaging.  He is getting morphine 4 mg IV, Zofran 4 mg IV, 1 L normal saline, and a Covid swab will be obtained.  He will remain n.p.o. and I have ordered thiamine 100 mg IV and CIWA protocol.  He and his wife understand and agree with the plan.      Clinical Course as of Oct 28 658  Wed Oct 29, 2019  0300 No substantial findings on urinalysis other than some proteinuria and a few ketones  Urinalysis, Complete w Microscopic(!) [CF]  0300 Lipase(!): 1,454 [CF]  0300 BP(!): 208/102 [CF]  0313 The patient and his wife informed his nurse that morphine "does not agree with him" and that he does better with Dilaudid.  Given his obvious acute illness, I have changed the order to Dilaudid 1 mg IV.  After his pain is better managed we will reassess his blood pressure to determine if he needs any antihypertensives but I think that his substantial elevation of his blood pressure is likely secondary to his acute illness and pain and addressing these issues will likely improve the blood pressure.   [CF]  0347 CT abd/pelvis for SBO (IV contrast only) [CF]  0347 CT findings are consistent with acute pancreatitis.  Patient does have gallstones but without evidence of acute cholecystitis.  Radiologist did not report any ductal dilatation.  I have put in a consult to the hospitalist for admission.   [CF]  0354 Discussed case in person with Dr. Toniann Fail with the hospitalist service who will admit.   [CF]  0400 Updated the patient and his wife at bedside.  Ordering a second liter of fluid as he is still tachycardic at about 120, however he is just not getting his pain medication.  I updated the hospitalist as well with the plan for reassessment of his blood pressure after analgesia.   [CF]    Clinical Course User Index [CF] Loleta Rose, MD     ____________________________________________  FINAL CLINICAL IMPRESSION(S) / ED DIAGNOSES  Final diagnoses:  Alcohol-induced acute pancreatitis, unspecified complication status  Hypertension, unspecified type  Alcohol dependence with other alcohol-induced disorder (HCC)     MEDICATIONS GIVEN DURING THIS VISIT:  Medications  lactated ringers infusion ( Intravenous Bolus from Bag 10/29/19 0643)  sulfaSALAzine (AZULFIDINE) EC  tablet 500 mg (has no administration in time range)  LORazepam (ATIVAN) tablet 1-4 mg (has no administration in time range)    Or  LORazepam (ATIVAN) injection 1-4 mg (has no administration in time range)  thiamine (VITAMIN B-1) tablet 100 mg (has no administration in time range)    Or  thiamine (B-1) injection 100 mg (has no administration in time range)  folic acid (FOLVITE) tablet 1 mg (has no administration in time range)  multivitamin with minerals tablet 1  tablet (has no administration in time range)  acetaminophen (TYLENOL) tablet 650 mg (has no administration in time range)    Or  acetaminophen (TYLENOL) suppository 650 mg (has no administration in time range)  ondansetron (ZOFRAN) tablet 4 mg (has no administration in time range)    Or  ondansetron (ZOFRAN) injection 4 mg (has no administration in time range)  LORazepam (ATIVAN) injection 0-4 mg (0 mg Intravenous Not Given 10/29/19 0451)    Followed by  LORazepam (ATIVAN) injection 0-4 mg (has no administration in time range)  heparin injection 5,000 Units (5,000 Units Subcutaneous Given 10/29/19 0642)  HYDROmorphone (DILAUDID) injection 1 mg (1 mg Intravenous Given 10/29/19 0642)  labetalol (NORMODYNE) injection 10 mg (10 mg Intravenous Given 10/29/19 0509)  sodium chloride 0.9 % bolus 1,000 mL (0 mLs Intravenous Stopped 10/29/19 0505)  ondansetron (ZOFRAN) injection 4 mg (4 mg Intravenous Given 10/29/19 0347)  HYDROmorphone (DILAUDID) injection 1 mg (1 mg Intravenous Given 10/29/19 0348)  iohexol (OMNIPAQUE) 300 MG/ML solution 100 mL (100 mLs Intravenous Contrast Given 10/29/19 0320)  sodium chloride 0.9 % bolus 1,000 mL (0 mLs Intravenous Stopped 10/29/19 0654)  labetalol (NORMODYNE) injection 10 mg (10 mg Intravenous Given 10/29/19 8850)     ED Discharge Orders    None      *Please note:  Khalik Pewitt Ramanathan was evaluated in Emergency Department on 10/29/2019 for the symptoms described in the history of present  illness. He was evaluated in the context of the global COVID-19 pandemic, which necessitated consideration that the patient might be at risk for infection with the SARS-CoV-2 virus that causes COVID-19. Institutional protocols and algorithms that pertain to the evaluation of patients at risk for COVID-19 are in a state of rapid change based on information released by regulatory bodies including the CDC and federal and state organizations. These policies and algorithms were followed during the patient's care in the ED.  Some ED evaluations and interventions may be delayed as a result of limited staffing during the pandemic.*  Note:  This document was prepared using Dragon voice recognition software and may include unintentional dictation errors.   Loleta Rose, MD 10/29/19 [phone]

## 2019-10-29 NOTE — ED Notes (Signed)
Patient's O2 sats dropped while sleeping to 88% on RA. Patient placed on 2L O2 via nasal cannula.

## 2019-10-30 DIAGNOSIS — F1010 Alcohol abuse, uncomplicated: Secondary | ICD-10-CM

## 2019-10-30 DIAGNOSIS — M459 Ankylosing spondylitis of unspecified sites in spine: Secondary | ICD-10-CM

## 2019-10-30 DIAGNOSIS — F10288 Alcohol dependence with other alcohol-induced disorder: Secondary | ICD-10-CM

## 2019-10-30 LAB — COMPREHENSIVE METABOLIC PANEL
ALT: 15 U/L (ref 0–44)
AST: 27 U/L (ref 15–41)
Albumin: 3.1 g/dL — ABNORMAL LOW (ref 3.5–5.0)
Alkaline Phosphatase: 71 U/L (ref 38–126)
Anion gap: 12 (ref 5–15)
BUN: 18 mg/dL (ref 8–23)
CO2: 23 mmol/L (ref 22–32)
Calcium: 8.3 mg/dL — ABNORMAL LOW (ref 8.9–10.3)
Chloride: 102 mmol/L (ref 98–111)
Creatinine, Ser: 1.43 mg/dL — ABNORMAL HIGH (ref 0.61–1.24)
GFR calc Af Amer: >60 mL/min (ref >60)
GFR calc non Af Amer: 52 mL/min — ABNORMAL LOW (ref >60)
Glucose, Bld: 120 mg/dL — ABNORMAL HIGH (ref 70–99)
Potassium: 4.6 mmol/L (ref 3.5–5.1)
Sodium: 137 mmol/L (ref 135–145)
Total Bilirubin: 0.9 mg/dL (ref 0.3–1.2)
Total Protein: 6.3 g/dL — ABNORMAL LOW (ref 6.5–8.1)

## 2019-10-30 LAB — CBC
HCT: 39.8 % (ref 39.0–52.0)
Hemoglobin: 13.6 g/dL (ref 13.0–17.0)
MCH: 34.4 pg — ABNORMAL HIGH (ref 26.0–34.0)
MCHC: 34.2 g/dL (ref 30.0–36.0)
MCV: 100.8 fL — ABNORMAL HIGH (ref 80.0–100.0)
Platelets: 111 10*3/uL — ABNORMAL LOW (ref 150–400)
RBC: 3.95 MIL/uL — ABNORMAL LOW (ref 4.22–5.81)
RDW: 14 % (ref 11.5–15.5)
WBC: 19.8 10*3/uL — ABNORMAL HIGH (ref 4.0–10.5)
nRBC: 0 % (ref 0.0–0.2)

## 2019-10-30 LAB — LIPASE, BLOOD: Lipase: 696 U/L — ABNORMAL HIGH (ref 11–51)

## 2019-10-30 LAB — GLUCOSE, CAPILLARY
Glucose-Capillary: 112 mg/dL — ABNORMAL HIGH (ref 70–99)
Glucose-Capillary: 121 mg/dL — ABNORMAL HIGH (ref 70–99)
Glucose-Capillary: 134 mg/dL — ABNORMAL HIGH (ref 70–99)
Glucose-Capillary: 142 mg/dL — ABNORMAL HIGH (ref 70–99)
Glucose-Capillary: 164 mg/dL — ABNORMAL HIGH (ref 70–99)

## 2019-10-30 LAB — HIV ANTIBODY (ROUTINE TESTING W REFLEX): HIV Screen 4th Generation wRfx: NONREACTIVE

## 2019-10-30 MED ORDER — SODIUM CHLORIDE 0.9 % IV SOLN
INTRAVENOUS | Status: DC
  Administered 2019-10-30 – 2019-10-31 (×4): via INTRAVENOUS

## 2019-10-30 MED ORDER — OXYCODONE HCL 5 MG PO TABS
10.0000 mg | ORAL_TABLET | ORAL | Status: DC | PRN
  Administered 2019-10-30 – 2019-10-31 (×5): 10 mg via ORAL
  Filled 2019-10-30 (×5): qty 2

## 2019-10-30 MED ORDER — BOOST / RESOURCE BREEZE PO LIQD CUSTOM
1.0000 | Freq: Three times a day (TID) | ORAL | Status: DC
  Administered 2019-10-30 – 2019-10-31 (×3): 1 via ORAL

## 2019-10-30 NOTE — TOC Initial Note (Signed)
Transition of Care [facility]) - Initial/Assessment Note    Patient Details  Name: Victor Willis MRN: [id number] Date of Birth: [birth year]  Transition of Care [facility]) CM/SW Contact:    Delphia Grates, LCSW Phone Number: 10/30/2019, 10:01 AM  Clinical Narrative:                  Patient arrived from his home. Patient was admitted due to hypertension, ankylosing spondylitis and alcohol abuse. Patient lives at home with his fiance, son and his daughter (who is living there temporarily). Patient is originally from [place] and will be returning after the Thanksgiving Holiday. Patient goes to a PCP in [place]. Patient goes to [facility] for his pharmacy near his home in [place] and can afford his medications. Patient is independent but does have DME at home (walkers) due to a past hip replacement but does not currently use it, but still has access to those walkers. Patient can drive himself but his fiance and son will be driving him from the hospital and back to [place]. Patient denied wanting any substance abuse treatment.   Expected Discharge Plan: Home/Self Care Barriers to Discharge: No Barriers Identified   Patient Goals and CMS Choice Patient states their goals for this hospitalization and ongoing recovery are:: "to go back to [place] after thanksgiving" CMS Medicare.gov Compare Post Acute Care list provided to:: Patient Choice offered to / list presented to : Patient  Expected Discharge Plan and Services Expected Discharge Plan: Home/Self Care In-house Referral: Clinical Social Work     Living arrangements for the past 2 months: Single Family Home                                      Prior Living Arrangements/Services Living arrangements for the past 2 months: Single Family Home Lives with:: Adult Children, Significant Other Patient language and need for interpreter reviewed:: Yes Do you feel safe going back to the place where you live?: Yes       Need for Family Participation in Patient Care: Yes (Comment) Care giver support system in place?: Yes (comment) Current home services: DME Criminal Activity/Legal Involvement Pertinent to Current Situation/Hospitalization: No - Comment as needed  Activities of Daily Living Home Assistive Devices/Equipment: Eyeglasses, Environmental consultant (specify type), Cane (specify quad or straight) ADL Screening (condition at time of admission) Patient's cognitive ability adequate to safely complete daily activities?: Yes Is the patient deaf or have difficulty hearing?: No Does the patient have difficulty seeing, even when wearing glasses/contacts?: No Does the patient have difficulty concentrating, remembering, or making decisions?: Yes Patient able to express need for assistance with ADLs?: Yes Does the patient have difficulty dressing or bathing?: No Independently performs ADLs?: Yes (appropriate for developmental age) Does the patient have difficulty walking or climbing stairs?: No Weakness of Legs: None Weakness of Arms/Hands: None  Permission Sought/Granted                  Emotional Assessment Appearance:: Appears stated age Attitude/Demeanor/Rapport: Engaged, Charismatic, Gracious Affect (typically observed): Accepting, Adaptable Orientation: : Oriented to Self, Oriented to Place, Oriented to  Time, Oriented to Situation Alcohol / Substance Use: Alcohol Use Psych Involvement: No (comment)  Admission diagnosis:  Cholelithiasis [K80.20] Alcohol dependence with other alcohol-induced disorder (HCC) [F10.288] Alcohol-induced acute pancreatitis, unspecified complication status [K85.20] Hypertension, unspecified type [I10] Acute pancreatitis [K85.90] Patient Active Problem List   Diagnosis Date Noted  .  Ankylosing spondylitis ([employer]) 10/29/2019  . Essential hypertension 10/29/2019  . Acute pancreatitis 10/29/2019  . Alcohol abuse 10/29/2019  . Hypertensive urgency 10/29/2019   PCP:  System, Pcp  Not In Pharmacy:   [facility] [place] [address] Phone: [phone] Fax: [phone]     Social Determinants of Health (SDOH) Interventions    Readmission Risk Interventions No flowsheet data found.

## 2019-10-30 NOTE — Progress Notes (Signed)
PROGRESS NOTE  Victor Willis OIN:[id number] DOB: 06-13-1956 DOA: 10/29/2019 PCP: System, Pcp Not In   LOS: 1 day   Brief Narrative / Interim history: 63 year old male with hypertension, ankylosing spondylitis, alcohol abuse who was admitted to the hospital with epigastric pain radiating into his back, nausea, vomiting, found to have acute pancreatitis. He denies similar symptoms in the past but does state that he has been drinking alcohol daily.  He has cut down significantly from, she used to drink in the past but still has 2 vodkas every day.  He mainly drinks because he is afraid that he will go into severe withdrawals.  Subjective / 24h Interval events: Subjectively he is feeling better today however still does have mid back pain, but with his history of ankylosing spondylitis he always has a degree of pain.  He did require 4 doses of IV Dilaudid overnight.  No nausea or vomiting.  Wants to go home  Assessment & Plan: Principal Problem Acute alcoholic pancreatitis -CT scan on admission did show cholelithiasis however LFTs have remained normal.  Right upper quadrant ultrasound without evidence of cholecystitis or CBD dilation, and his pancreatitis is likely alcohol induced. -Subjectively improving, lipase is down -Continue IV fluids, given subjective improvement will allow clear liquid diet and monitor -If tolerates clear liquid diet for breakfast and lunch will advance to full liquid diet and potentially could be released from the hospital tomorrow if he tolerates it well  Active Problems Acute kidney injury -Baseline creatinine 1, currently at 1.4.  Increase fluid rate.  Leukocytosis -Likely reactive, monitor  Alcohol use -Continue CIWA for alcohol abuse  Hypertension -Continue IV labetalol  Scheduled Meds: . folic acid  1 mg Oral Daily  . heparin  5,000 Units Subcutaneous Q8H  . LORazepam  0-4 mg Intravenous Q6H   Followed by  . [START ON 10/31/2019] LORazepam  0-4  mg Intravenous Q12H  . multivitamin with minerals  1 tablet Oral Daily  . nicotine  14 mg Transdermal Daily  . sulfaSALAzine  500 mg Oral BID  . thiamine  100 mg Oral Daily   Or  . thiamine  100 mg Intravenous Daily   Continuous Infusions: . sodium chloride 150 mL/hr at 10/30/19 1010   PRN Meds:.acetaminophen **OR** acetaminophen, HYDROmorphone (DILAUDID) injection, labetalol, LORazepam **OR** LORazepam, ondansetron **OR** ondansetron (ZOFRAN) IV  DVT prophylaxis: Heparin Code Status: Full code Family Communication: Discussed with patient Disposition Plan: Home possibly 24 hours if tolerates eating  Consultants:  None  Procedures:  None   Microbiology  SARS-CoV-2 11/18-negative  Antimicrobials: None   Objective: Vitals:   10/29/19 1720 10/29/19 1903 10/30/19 0440 10/30/19 0827  BP:  (!) 157/97 (!) 151/88 (!) 162/90  Pulse:  98 97 99  Resp: 20 18 20 20   Temp:  98.3 F (36.8 C) 98 F (36.7 C) 98.7 F (37.1 C)  TempSrc:  Oral Oral Oral  SpO2:  96% 95% 95%  Weight:      Height:        Intake/Output Summary (Last 24 hours) at 10/30/2019 1027 Last data filed at 10/30/2019 0759 Gross per 24 hour  Intake -  Output 550 ml  Net -550 ml   Filed Weights   10/28/19 2306  Weight: 77.1 kg    Examination:  Constitutional: NAD Eyes: lids and conjunctivae normal, no scleral icterus ENMT: Mucous membranes are moist.  Neck: normal, supple Respiratory: clear to auscultation bilaterally, no wheezing, no crackles. Normal respiratory effort.  Cardiovascular: Regular rate and rhythm,  no murmurs / rubs / gallops. No LE edema. Good peripheral pulses Abdomen: Mild right upper quadrant tenderness. Bowel sounds positive.  Musculoskeletal: no clubbing / cyanosis.  Skin: no rashes Neurologic: CN 2-12 grossly intact. Strength 5/5 in all 4.    Data Reviewed: I have independently reviewed following labs and imaging studies   CBC: Recent Labs  Lab 10/28/19 2316 10/29/19  0502 10/30/19 0531  WBC 17.3* 18.3* 19.8*  NEUTROABS 16.2* 17.0*  --   HGB 15.4 15.3 13.6  HCT 45.1 42.6 39.8  MCV 98.7 95.9 100.8*  PLT 140* 128* 111*   Basic Metabolic Panel: Recent Labs  Lab 10/28/19 2316 10/29/19 0502 10/30/19 0531  NA 137 137 137  K 4.2 4.1 4.6  CL 103 105 102  CO2 20* 23 23  GLUCOSE 183* 180* 120*  BUN 15 15 18   CREATININE 1.15 1.08 1.43*  CALCIUM 9.1 8.4* 8.3*   Liver Function Tests: Recent Labs  Lab 10/28/19 2316 10/29/19 0502 10/30/19 0531  AST 35 36 27  ALT 22 20 15   ALKPHOS 83 76 71  BILITOT 0.8 0.9 0.9  PROT 7.7 7.2 6.3*  ALBUMIN 3.7 3.4* 3.1*   Coagulation Profile: No results for input(s): INR, PROTIME in the last 168 hours. HbA1C: No results for input(s): HGBA1C in the last 72 hours. CBG: Recent Labs  Lab 10/29/19 0637 10/29/19 1219 10/29/19 1944 10/30/19 0013 10/30/19 0541  GLUCAP 161* 145* 134* 121* 112*    Recent Results (from the past 240 hour(s))  SARS CORONAVIRUS 2 (TAT 6-24 HRS) Nasopharyngeal Nasopharyngeal Swab     Status: None   Collection Time: 10/29/19  3:56 AM   Specimen: Nasopharyngeal Swab  Result Value Ref Range Status   SARS Coronavirus 2 NEGATIVE NEGATIVE Final    Comment: (NOTE) SARS-CoV-2 target nucleic acids are NOT DETECTED. The SARS-CoV-2 RNA is generally detectable in upper and lower respiratory specimens during the acute phase of infection. Negative results do not preclude SARS-CoV-2 infection, do not rule out co-infections with other pathogens, and should not be used as the sole basis for treatment or other patient management decisions. Negative results must be combined with clinical observations, patient history, and epidemiological information. The expected result is Negative. Fact Sheet for Patients: HairSlick.nohttps://www.fda.gov/media/138098/download Fact Sheet for Healthcare Providers: quierodirigir.comhttps://www.fda.gov/media/138095/download This test is not yet approved or cleared by the [place] FDA  and  has been authorized for detection and/or diagnosis of SARS-CoV-2 by FDA under an Emergency Use Authorization (EUA). This EUA will remain  in effect (meaning this test can be used) for the duration of the COVID-19 declaration under Section 56 4(b)(1) of the Act, 21 U.S.C. section 360bbb-3(b)(1), unless the authorization is terminated or revoked sooner. Performed at [facility], [address] [place], [place] [address]      Radiology Studies: Koreas Abdomen Limited Ruq  Result Date: 10/29/2019 CLINICAL DATA:  Right-sided abdominal pain EXAM: ULTRASOUND ABDOMEN LIMITED RIGHT UPPER QUADRANT COMPARISON:  CT abdomen and pelvis October 29, 2019 FINDINGS: Gallbladder: Within the gallbladder, there are echogenic foci which move and shadow consistent with cholelithiasis. Largest gallstone measures 8 mm in length. No gallbladder wall thickening or pericholecystic fluid. No sonographic Murphy sign noted by sonographer. Common bile duct: Diameter: 5 mm.  No intrahepatic biliary duct dilatation. Liver: No focal lesion identified. Within normal limits in parenchymal echogenicity. Portal vein is patent on color Doppler imaging with normal direction of blood flow towards the liver. Other: None. IMPRESSION: Cholelithiasis. No gallbladder wall thickening or  pericholecystic fluid. Study otherwise unremarkable. Electronically Signed   By: Bretta Bang III M.D.   On: 10/29/2019 12:05   Pamella Pert, MD, PhD [facility]  Between 7 am - 7 pm I am available, please contact me via Amion or Securechat  Between 7 pm - 7 am I am not available, please contact night coverage MD/APP via Amion

## 2019-10-30 NOTE — Progress Notes (Signed)
Initial Nutrition Assessment  DOCUMENTATION CODES:   Obesity unspecified  INTERVENTION:   Boost Breeze po TID, each supplement provides 250 kcal and 9 grams of protein  MVI, thiamine and folic acid in setting of etoh abuse   Pt likely at refeed risk; recommend monitor K, Mg and P labs as oral intake improves.   NUTRITION DIAGNOSIS:   Inadequate oral intake related to acute illness as evidenced by other (comment)(pt on clear liquid diet).  GOAL:   Patient will meet greater than or equal to 90% of their needs  MONITOR:   PO intake, Supplement acceptance, Labs, Weight trends, Skin, I & O's  REASON FOR ASSESSMENT:   Malnutrition Screening Tool    ASSESSMENT:   63 year old male with hypertension, ankylosing spondylitis, hep C, alcohol abuse who came into the hospital with worsening abdominal pain, nausea vomiting. Pt found to have cholelithiais and pancreatitis   Pt with decreased appetite and oral intake pta r/t nausea and vomiting. Pt continues to have decreased appetite and oral intake today but reports that he is feeling better. Pt initiated clear liquid diet today. RD will add supplements to help pt meet his estimated needs. Pt on CIWA protocol. Per chart, pt appears to weigh ~175-180lbs at baseline; there is not however a very detailed weight history in chart. Pt denies any weight loss.    Medications reviewed and include: folic acid, heparin, MVI, Nicotine, sulfasalazine, thiamine, NaCl @150ml /hr   Labs reviewed: K 4.6 wnl, creat 1.43(H), lipase 696(H) Wbc- 19.8(H), MCV 100.8(H), MCH 34.4(H)  NUTRITION - FOCUSED PHYSICAL EXAM:    Most Recent Value  Orbital Region  No depletion  Upper Arm Region  No depletion  Thoracic and Lumbar Region  No depletion  Buccal Region  No depletion  Temple Region  No depletion  Clavicle Bone Region  No depletion  Clavicle and Acromion Bone Region  No depletion  Scapular Bone Region  No depletion  Dorsal Hand  No depletion  Patellar  Region  No depletion  Anterior Thigh Region  No depletion  Posterior Calf Region  No depletion  Edema (RD Assessment)  None  Hair  Reviewed  Eyes  Reviewed  Mouth  Reviewed  Skin  Reviewed  Nails  Reviewed     Diet Order:   Diet Order            Diet clear liquid Room service appropriate? Yes; Fluid consistency: Thin  Diet effective now             EDUCATION NEEDS:   Education needs have been addressed  Skin:  Skin Assessment: Reviewed RN Assessment  Last BM:  11/18  Height:   Ht Readings from Last 1 Encounters:  10/28/19 5\' 1"  (1.549 m)    Weight:   Wt Readings from Last 1 Encounters:  10/28/19 77.1 kg    Ideal Body Weight:  50.9 kg  BMI:  Body mass index is 32.12 kg/m.  Estimated Nutritional Needs:   Kcal:  1700-2000kcal/day  Protein:  85-100g/day  Fluid:  >1.5L/day  Koleen Distance MS, RD, LDN Pager #- [phone] Office#- [phone] After Hours Pager: [phone]

## 2019-10-30 NOTE — Plan of Care (Signed)
  Problem: Activity: Goal: Risk for activity intolerance will decrease Outcome: Progressing  Patient ambulating in bathroom

## 2019-10-31 DIAGNOSIS — I10 Essential (primary) hypertension: Secondary | ICD-10-CM

## 2019-10-31 DIAGNOSIS — E669 Obesity, unspecified: Secondary | ICD-10-CM | POA: Diagnosis present

## 2019-10-31 DIAGNOSIS — Z720 Tobacco use: Secondary | ICD-10-CM | POA: Diagnosis present

## 2019-10-31 LAB — BASIC METABOLIC PANEL
Anion gap: 9 (ref 5–15)
BUN: 19 mg/dL (ref 8–23)
CO2: 21 mmol/L — ABNORMAL LOW (ref 22–32)
Calcium: 7.7 mg/dL — ABNORMAL LOW (ref 8.9–10.3)
Chloride: 106 mmol/L (ref 98–111)
Creatinine, Ser: 1.39 mg/dL — ABNORMAL HIGH (ref 0.61–1.24)
GFR calc Af Amer: >60 mL/min (ref >60)
GFR calc non Af Amer: 54 mL/min — ABNORMAL LOW (ref >60)
Glucose, Bld: 100 mg/dL — ABNORMAL HIGH (ref 70–99)
Potassium: 3.8 mmol/L (ref 3.5–5.1)
Sodium: 136 mmol/L (ref 135–145)

## 2019-10-31 LAB — CBC
HCT: 37 % — ABNORMAL LOW (ref 39.0–52.0)
Hemoglobin: 12.5 g/dL — ABNORMAL LOW (ref 13.0–17.0)
MCH: 33.9 pg (ref 26.0–34.0)
MCHC: 33.8 g/dL (ref 30.0–36.0)
MCV: 100.3 fL — ABNORMAL HIGH (ref 80.0–100.0)
Platelets: 107 10*3/uL — ABNORMAL LOW (ref 150–400)
RBC: 3.69 MIL/uL — ABNORMAL LOW (ref 4.22–5.81)
RDW: 13.7 % (ref 11.5–15.5)
WBC: 12.4 10*3/uL — ABNORMAL HIGH (ref 4.0–10.5)
nRBC: 0 % (ref 0.0–0.2)

## 2019-10-31 LAB — GLUCOSE, CAPILLARY
Glucose-Capillary: 104 mg/dL — ABNORMAL HIGH (ref 70–99)
Glucose-Capillary: 91 mg/dL (ref 70–99)

## 2019-10-31 MED ORDER — METOPROLOL SUCCINATE ER 25 MG PO TB24: 25.0000 mg | ORAL_TABLET | Freq: Every day | ORAL | 11 refills | Status: DC

## 2019-10-31 MED ORDER — CHLORDIAZEPOXIDE HCL 10 MG PO CAPS: ORAL_CAPSULE | ORAL | 0 refills | Status: DC

## 2019-10-31 MED ORDER — CHLORDIAZEPOXIDE HCL 10 MG PO CAPS: ORAL_CAPSULE | ORAL | 0 refills | Status: AC

## 2019-10-31 MED ORDER — LABETALOL HCL 5 MG/ML IV SOLN
10.0000 mg | INTRAVENOUS | Status: DC | PRN
  Administered 2019-10-31: 10 mg via INTRAVENOUS

## 2019-10-31 NOTE — Progress Notes (Signed)
Victor Willis to be D/C'd home with fiancee per MD order.  Discussed prescriptions and follow up appointments with the patient. Prescriptions given to patient, medication list explained in detail. Pt verbalized understanding.  Allergies as of 10/31/2019       Reactions   Celebrex [celecoxib] Rash        Medication List     TAKE these medications    aspirin EC 81 MG tablet Take 81 mg by mouth every other day.   chlordiazePOXIDE 10 MG capsule Commonly known as: LIBRIUM Take 4 capsules (40 mg total) by mouth 3 (three) times daily for 1 day, THEN 3 capsules (30 mg total) 3 (three) times daily for 1 day, THEN 2 capsules (20 mg total) 3 (three) times daily for 1 day, THEN 1 capsule (10 mg total) 3 (three) times daily for 1 day. Start taking on: October 31, 2019   cyclobenzaprine 10 MG tablet Commonly known as: FLEXERIL Take 10 mg by mouth 2 (two) times daily.   lisinopril 40 MG tablet Commonly known as: ZESTRIL Take 40 mg by mouth daily.   metoprolol succinate 25 MG 24 hr tablet Commonly known as: Toprol XL Take 1 tablet (25 mg total) by mouth daily.   MULTIVITAMIN GUMMIES ADULT PO Take 2 each by mouth daily.   oxycodone 30 MG immediate release tablet Commonly known as: ROXICODONE Take 30 mg by mouth 5 (five) times daily.   sulfaSALAzine 500 MG tablet Commonly known as: AZULFIDINE Take 500 mg by mouth 2 (two) times daily.        Vitals:   10/31/19 0547 10/31/19 1341  BP: (!) 146/84 (!) 142/83  Pulse: (!) 101 (!) 106  Resp:  18  Temp:  97.6 F (36.4 C)  SpO2:  94%    Skin clean, dry and intact without evidence of skin break down, no evidence of skin tears noted. IV catheter discontinued intact. Site without signs and symptoms of complications. Dressing and pressure applied. Pt denies pain at this time. No complaints noted.  An After Visit Summary was printed and given to the patient. Patient escorted via [place], and D/C home via private auto.  [place] [facility]  Victor Willis

## 2019-10-31 NOTE — Care Management Important Message (Signed)
Important Message  Patient Details  Name: Victor Willis MRN: [id number] Date of Birth: 11/05/1956   Medicare Important Message Given:  Yes     Dannette Barbara 10/31/2019, 12:24 PM

## 2019-10-31 NOTE — Discharge Summary (Signed)
Discharge Summary  Victor Willis ZOX:[id number] DOB: 01-Jan-1956  PCP: System, Pcp Not In  Admit date: 10/29/2019 Discharge date: 10/31/2019  Time spent: 35 minutes  Recommendations for Outpatient Follow-up:  1. New medication: Toprol-XL 25 mg p.o. daily 2. New medication: Librium taper over next 4 days  Discharge Diagnoses:  Active Hospital Problems   Diagnosis Date Noted   Acute pancreatitis 10/29/2019   Obesity (BMI 30-39.9) 10/31/2019   Tobacco abuse 10/31/2019   Ankylosing spondylitis (HCC) 10/29/2019   Essential hypertension 10/29/2019   Alcohol abuse 10/29/2019   Hypertensive urgency 10/29/2019    Resolved Hospital Problems  No resolved problems to display.    Discharge Condition: Improved, being discharged home  Diet recommendation: Low-sodium heart healthy  Vitals:   10/31/19 0529 10/31/19 0547  BP: (!) 168/87 (!) 146/84  Pulse: (!) 103 (!) 101  Resp:    Temp: 98.7 F (37.1 C)   SpO2: 93%     History of present illness:  63 year old male past medical history of hypertension, obesity, ankylosing spondylitis and alcohol abuse admitted to the hospital on 11/18 with midepigastric pain radiating to his back.  Patient found to have acute pancreatitis.  Patient stated that he used to be a heavy drinker, but had since cut down, but still drank about 2 vodkas per day.  Patient states that the only reason he still drinks is because he is afraid of going into severe withdrawals.  Admitted to the hospital service.  Hospital Course:  Principal Problem:   Acute pancreatitis: CT scan of abdomen noted cholelithiasis, right upper quadrant ultrasound noted no evidence of cholecystitis or common bile duct dilatation.  Pancreatitis felt to be secondary to alcohol.  Lipase followed and by following day, improved.  Patient started on clear liquids.  By day of discharge, 11/20, patient able to tolerate full liquids and then this was advanced to solid foods which she  tolerated.  He is advised to quit drinking alcohol, see below. Active Problems:   Ankylosing spondylitis (HCC): Stable.  Patient is on chronic pain medication.    Essential hypertension: Patient initially placed on as needed labetalol.  On day of discharge, at times noted to be tachycardic.  Discussion with the patient, he tells me he used to be on Toprol-XL switched over to lisinopril.  Blood pressure still elevated, so I have restarted Toprol at a lower dose, 25 mg p.o. daily.    Alcohol abuse: Patient placed on CIWA protocol and Ativan taper during hospitalization.  He tolerated this well.  He will be discharged on a 4-day Librium taper.  He tells me he will no longer drink at all.    Obesity (BMI 30-39.9): Patient meets criteria BMI greater than 30.    Tobacco abuse: Counseled.  Nicotine patch during hospitalization.   Procedures:  None  Consultations:  None  Discharge Exam: BP (!) 146/84    Pulse (!) 101    Temp 98.7 F (37.1 C) (Oral)    Resp 20    Ht  (1.549 m)    Wt 77.1 kg    SpO2 93%    BMI 32.12 kg/m   General: Alert and oriented x3, no acute distress Cardiovascular: Regular rate and rhythm, S1-S2 Respiratory: Clear to auscultation bilaterally  Discharge Instructions You were cared for by a hospitalist during your hospital stay. If you have any questions about your discharge medications or the care you received while you were in the hospital after you are discharged, you can call the  unit and asked to speak with the hospitalist on call if the hospitalist that took care of you is not available. Once you are discharged, your primary care physician will handle any further medical issues. Please note that NO REFILLS for any discharge medications will be authorized once you are discharged, as it is imperative that you return to your primary care physician (or establish a relationship with a primary care physician if you do not have one) for your aftercare needs so that they  can reassess your need for medications and monitor your lab values.  Discharge Instructions    Diet - low sodium heart healthy   Complete by: As directed    Increase activity slowly   Complete by: As directed      Allergies as of 10/31/2019      Reactions   Celebrex [celecoxib] Rash      Medication List    TAKE these medications   aspirin EC 81 MG tablet Take 81 mg by mouth every other day.   chlordiazePOXIDE 10 MG capsule Commonly known as: LIBRIUM Take 4 capsules (40 mg total) by mouth 3 (three) times daily for 1 day, THEN 3 capsules (30 mg total) 3 (three) times daily for 1 day, THEN 2 capsules (20 mg total) 3 (three) times daily for 1 day, THEN 1 capsule (10 mg total) 3 (three) times daily for 1 day. Start taking on: October 31, 2019   cyclobenzaprine 10 MG tablet Commonly known as: FLEXERIL Take 10 mg by mouth 2 (two) times daily.   lisinopril 40 MG tablet Commonly known as: ZESTRIL Take 40 mg by mouth daily.   metoprolol succinate 25 MG 24 hr tablet Commonly known as: Toprol XL Take 1 tablet (25 mg total) by mouth daily.   MULTIVITAMIN GUMMIES ADULT PO Take 2 each by mouth daily.   oxycodone 30 MG immediate release tablet Commonly known as: ROXICODONE Take 30 mg by mouth 5 (five) times daily.   sulfaSALAzine 500 MG tablet Commonly known as: AZULFIDINE Take 500 mg by mouth 2 (two) times daily.      Allergies  Allergen Reactions   Celebrex [Celecoxib] Rash      The results of significant diagnostics from this hospitalization (including imaging, microbiology, ancillary and laboratory) are listed below for reference.    Significant Diagnostic Studies: Ct Abd/pelvis For Sbo (iv Contrast Only)  Result Date: 10/29/2019 CLINICAL DATA:  Nausea, vomiting. EXAM: CT ABDOMEN AND PELVIS WITH CONTRAST TECHNIQUE: Multidetector CT imaging of the abdomen and pelvis was performed using the standard protocol following bolus administration of intravenous contrast.  CONTRAST:  169mL OMNIPAQUE IOHEXOL 300 MG/ML  SOLN COMPARISON:  None. FINDINGS: Lower chest: Bibasilar atelectasis. Coronary artery calcifications. Heart is borderline in size. Hepatobiliary: No focal hepatic abnormality. 11 mm gallstone layering within the gallbladder. Pancreas: Calcifications within the pancreatic head compatible with chronic pancreatitis. There is inflammation around the pancreatic head and uncinate process compatible with acute pancreatitis. Pancreatic duct is mildly prominent at 5 mm. Spleen: No focal abnormality.  Normal size. Adrenals/Urinary Tract: Bilateral renal cysts. No hydronephrosis. Adrenal glands unremarkable. Urinary bladder obscured by beam hardening artifact from bilateral hip replacements. Stomach/Bowel: Scattered left colonic diverticulosis. No active diverticulitis. Stomach and small bowel decompressed, unremarkable. Vascular/Lymphatic: Heavily calcified aorta and iliac vessels. No evidence of aneurysm or adenopathy. Reproductive: Lower pelvis obscured by beam hardening artifact. Other: No free fluid or free air. Musculoskeletal: Bilateral hip replacements. No acute bony abnormality. IMPRESSION: Inflammation around the pancreatic head, uncinate process and  duodenal sweep most compatible with acute pancreatitis. Calcifications within the pancreatic head compatible with chronic pancreatitis. Bilateral renal cysts. Scattered left colonic diverticulosis.  No active diverticulitis. Aortic atherosclerosis. Cholelithiasis. Electronically Signed   By: Charlett NoseKevin  Dover M.D.   On: 10/29/2019 03:37   [place] Abdomen Limited Ruq  Result Date: 10/29/2019 CLINICAL DATA:  Right-sided abdominal pain EXAM: ULTRASOUND ABDOMEN LIMITED RIGHT UPPER QUADRANT COMPARISON:  CT abdomen and pelvis October 29, 2019 FINDINGS: Gallbladder: Within the gallbladder, there are echogenic foci which move and shadow consistent with cholelithiasis. Largest gallstone measures 8 mm in length. No gallbladder wall  thickening or pericholecystic fluid. No sonographic Murphy sign noted by sonographer. Common bile duct: Diameter: 5 mm.  No intrahepatic biliary duct dilatation. Liver: No focal lesion identified. Within normal limits in parenchymal echogenicity. Portal vein is patent on color Doppler imaging with normal direction of blood flow towards the liver. Other: None. IMPRESSION: Cholelithiasis. No gallbladder wall thickening or pericholecystic fluid. Study otherwise unremarkable. Electronically Signed   By: Bretta BangWilliam  Woodruff III M.D.   On: 10/29/2019 12:05    Microbiology: Recent Results (from the past 240 hour(s))  SARS CORONAVIRUS 2 (TAT 6-24 HRS) Nasopharyngeal Nasopharyngeal Swab     Status: None   Collection Time: 10/29/19  3:56 AM   Specimen: Nasopharyngeal Swab  Result Value Ref Range Status   SARS Coronavirus 2 NEGATIVE NEGATIVE Final    Comment: (NOTE) SARS-CoV-2 target nucleic acids are NOT DETECTED. The SARS-CoV-2 RNA is generally detectable in upper and lower respiratory specimens during the acute phase of infection. Negative results do not preclude SARS-CoV-2 infection, do not rule out co-infections with other pathogens, and should not be used as the sole basis for treatment or other patient management decisions. Negative results must be combined with clinical observations, patient history, and epidemiological information. The expected result is Negative. Fact Sheet for Patients: HairSlick.nohttps://www.fda.gov/media/138098/download Fact Sheet for Healthcare Providers: quierodirigir.comhttps://www.fda.gov/media/138095/download This test is not yet approved or cleared by the [place] FDA and  has been authorized for detection and/or diagnosis of SARS-CoV-2 by FDA under an Emergency Use Authorization (EUA). This EUA will remain  in effect (meaning this test can be used) for the duration of the COVID-19 declaration under Section 56 4(b)(1) of the Act, 21 U.S.C. section 360bbb-3(b)(1), unless the  authorization is terminated or revoked sooner. Performed at [facility], [address] [place], [place] [address]      Labs: Basic Metabolic Panel: Recent Labs  Lab 10/28/19 2316 10/29/19 0502 10/30/19 0531 10/31/19 0510  NA 137 137 137 136  K 4.2 4.1 4.6 3.8  CL 103 105 102 106  CO2 20* 23 23 21*  GLUCOSE 183* 180* 120* 100*  BUN 15 15 18 19   CREATININE 1.15 1.08 1.43* 1.39*  CALCIUM 9.1 8.4* 8.3* 7.7*   Liver Function Tests: Recent Labs  Lab 10/28/19 2316 10/29/19 0502 10/30/19 0531  AST 35 36 27  ALT 22 20 15   ALKPHOS 83 76 71  BILITOT 0.8 0.9 0.9  PROT 7.7 7.2 6.3*  ALBUMIN 3.7 3.4* 3.1*   Recent Labs  Lab 10/28/19 2316 10/30/19 0531  LIPASE 1,454* 696*   No results for input(s): AMMONIA in the last 168 hours. CBC: Recent Labs  Lab 10/28/19 2316 10/29/19 0502 10/30/19 0531 10/31/19 0510  WBC 17.3* 18.3* 19.8* 12.4*  NEUTROABS 16.2* 17.0*  --   --   HGB 15.4 15.3 13.6 12.5*  HCT 45.1 42.6 39.8 37.0*  MCV 98.7 95.9 100.8* 100.3*  PLT 140* 128* 111* 107*   Cardiac Enzymes: No results for input(s): CKTOTAL, CKMB, CKMBINDEX, TROPONINI in the last 168 hours. BNP: BNP (last 3 results) No results for input(s): BNP in the last 8760 hours.  ProBNP (last 3 results) No results for input(s): PROBNP in the last 8760 hours.  CBG: Recent Labs  Lab 10/30/19 1152 10/30/19 1753 10/30/19 2345 10/31/19 0532 10/31/19 1139  GLUCAP 134* 164* 142* 104* 91       Signed:  Hollice Espy, MD [facility] 10/31/2019, 1:17 PM

## 2019-10-31 NOTE — Discharge Instructions (Signed)
Alcohol Abuse and Dependence Information, Adult Alcohol is a widely available drug. People drink alcohol in different amounts. People who drink alcohol very often and in large amounts often have problems during and after drinking. They may develop what is called an alcohol use disorder. There are two main types of alcohol use disorders:  Alcohol abuse. This is when you use alcohol too much or too often. You may use alcohol to make yourself feel happy or to reduce stress. You may have a hard time setting a limit on the amount you drink.  Alcohol dependence. This is when you use alcohol consistently for a period of time, and your body changes as a result. This can make it hard to stop drinking because you may start to feel sick or feel different when you do not use alcohol. These symptoms are known as withdrawal. How can alcohol abuse and dependence affect me? Alcohol abuse and dependence can have a negative effect on your life. Drinking too much can lead to addiction. You may feel like you need alcohol to function normally. You may drink alcohol before work in the morning, during the day, or as soon as you get home from work in the evening. These actions can result in:  Poor work performance.  Job loss.  Financial problems.  Car crashes or criminal charges from driving after drinking alcohol.  Problems in your relationships with friends and family.  Losing the trust and respect of coworkers, friends, and family. Drinking heavily over a long period of time can permanently damage your body and brain, and can cause lifelong health issues, such as:  Damage to your liver or pancreas.  Heart problems, high blood pressure, or stroke.  Certain cancers.  Decreased ability to fight infections.  Brain or nerve damage.  Depression.  Early (premature) death. If you are careless or you crave alcohol, it is easy to drink more than your body can handle (overdose). Alcohol overdose is a serious  situation that requires hospitalization. It may lead to permanent injuries or death. What can increase my risk?  Having a family history of alcohol abuse.  Having depression or other mental health conditions.  Beginning to drink at an early age.  Binge drinking often.  Experiencing trauma, stress, and an unstable home life during childhood.  Spending time with people who drink often. What actions can I take to prevent or manage alcohol abuse and dependence?  Do not drink alcohol if: ? Your health care provider tells you not to drink. ? You are pregnant, may be pregnant, or are planning to become pregnant.  If you drink alcohol: ? Limit how much you use to:  0-1 drink a day for women.  0-2 drinks a day for men. ? Be aware of how much alcohol is in your drink. In the [place] one drink equals one 12 oz bottle of beer (355 mL), one 5 oz glass of wine (148 mL), or one 1 oz glass of hard liquor (44 mL).  Stop drinking if you have been drinking too much. This can be very hard to do if you are used to abusing alcohol. If you begin to have withdrawal symptoms, talk with your health care provider or a person that you trust. These symptoms may include anxiety, shaky hands, headache, nausea, sweating, or not being able to sleep.  Choose to drink nonalcoholic beverages in social gatherings and places where there may be alcohol. Activity  Spend more time on activities that you enjoy that do  not involve alcohol, like hobbies or exercise.  Find healthy ways to cope with stress, such as exercise, meditation, or spending time with people you care about. General information  Talk to your family, coworkers, and friends about supporting you in your efforts to stop drinking. If they drink, ask them not to drink around you. Spend more time with people who do not drink alcohol.  If you think that you have an alcohol dependency problem: ? Tell friends or family about your concerns. ? Talk with your  health care provider or another health professional about where to get help. ? Work with a Paramedic and a Network engineer. ? Consider joining a support group for people who struggle with alcohol abuse and dependence. Where to find support   Your health care provider.  SMART Recovery: www.smartrecovery.org Therapy and support groups  Local treatment centers or chemical dependency counselors.  Local AA groups in your community: SalaryStart.tn Where to find more information  Centers for Disease Control and Prevention: FootballExhibition.com.br  [employer] on Alcohol Abuse and Alcoholism: BasicStudents.dk  Alcoholics Anonymous (AA): SalaryStart.tn Contact a health care provider if:  You drank more or for longer than you intended on more than one occasion.  You tried to stop drinking or to cut back on how much you drink, but you were not able to.  You often drink to the point of vomiting or passing out.  You want to drink so badly that you cannot think about anything else.  You have problems in your life due to drinking, but you continue to drink.  You keep drinking even though you feel anxious, depressed, or have experienced memory loss.  You have stopped doing the things you used to enjoy in order to drink.  You have to drink more than you used to in order to get the effect you want.  You experience anxiety, sweating, nausea, shakiness, and trouble sleeping when you try to stop drinking. Get help right away if:  You have thoughts about hurting yourself or others.  You have serious withdrawal symptoms, including: ? Confusion. ? Racing heart. ? High blood pressure. ? Fever. If you ever feel like you may hurt yourself or others, or have thoughts about taking your own life, get help right away. You can go to your nearest emergency department or call:  Your local emergency services (911 in the [place]  A suicide crisis helpline, such as the National Suicide Prevention  Lifeline at (580)867-8684. This is open 24 hours a day. Summary  Alcohol abuse and dependence can have a negative effect on your life. Drinking too much or too often can lead to addiction.  If you drink alcohol, limit how much you use.  If you are having trouble keeping your drinking under control, find ways to change your behavior. Hobbies, calming activities, exercise, or support groups can help.  If you feel you need help with changing your drinking habits, talk with your health care provider, a good friend, or a therapist, or go to an AA group. This information is not intended to replace advice given to you by your health care provider. Make sure you discuss any questions you have with your health care provider. Document Released: 11/21/2016 Document Revised: 03/18/2019 Document Reviewed: 02/04/2019 Elsevier Patient Education  2020 [employer].   Acute Pancreatitis  The pancreas is a gland that is located behind the stomach on the left side of the abdomen. It produces enzymes that help to digest food. The pancreas also  releases the hormones glucagon and insulin, which help to regulate blood sugar. Acute pancreatitis happens when inflammation of the pancreas suddenly occurs and the pancreas becomes irritated and swollen. Most acute attacks last a few days and cause serious problems. Some people become dehydrated and develop low blood pressure. In severe cases, bleeding in the abdomen can lead to shock and can be life-threatening. The lungs, heart, and kidneys may fail. What are the causes? This condition may be caused by:  Alcohol abuse.  Drug abuse.  Gallstones or other conditions that can block the tube that drains the pancreas (pancreatic duct).  A tumor in the pancreas. Other causes include:  Certain medicines.  Exposure to certain chemicals.  Diabetes.  An infection in the pancreas.  Damage caused by an accident (trauma).  The poison (venom) from a scorpion  bite.  Abdominal surgery.  Autoimmune pancreatitis. This is when the body's disease-fighting (immune) system attacks the pancreas.  Genes that are passed from parent to child (inherited). In some cases, the cause of this condition is not known. What are the signs or symptoms? Symptoms of this condition include:  Pain in the upper abdomen that may radiate to the back. Pain may be severe.  Tenderness and swelling of the abdomen.  Nausea and vomiting.  Fever. How is this diagnosed? This condition may be diagnosed based on:  A physical exam.  Blood tests.  Imaging tests, such as X-rays, CT or MRI scans, or an ultrasound of the abdomen. How is this treated? Treatment for this condition usually requires a stay in the hospital. Treatment for this condition may include:  Pain medicine.  Fluid replacement through an IV.  Placing a tube in the stomach to remove stomach contents and to control vomiting (NG tube, or nasogastric tube).  Not eating for 3-4 days. This gives the pancreas a rest, because enzymes are not being produced that can cause further damage.  Antibiotic medicines, if your condition is caused by an infection.  Treating any underlying conditions that may be the cause.  Steroid medicines, if your condition is caused by your immune system attacking your body's own tissues (autoimmune disease).  Surgery on the pancreas or gallbladder. Follow these instructions at home: Eating and drinking   Follow instructions from your health care provider about diet. This may involve avoiding alcohol and decreasing the amount of fat in your diet.  Eat smaller, more frequent meals. This reduces the amount of digestive fluids that the pancreas produces.  Drink enough fluid to keep your urine pale yellow.  Do not drink alcohol if it caused your condition. General instructions  Take over-the-counter and prescription medicines only as told by your health care provider.  Do  not drive or use heavy machinery while taking prescription pain medicine.  Ask your health care provider if the medicine prescribed to you can cause constipation. You may need to take steps to prevent or treat constipation, such as: ? Take an over-the-counter or prescription medicine for constipation. ? Eat foods that are high in fiber such as whole grains and beans. ? Limit foods that are high in fat and processed sugars, such as fried or sweet foods.  Do not use any products that contain nicotine or tobacco, such as cigarettes, e-cigarettes, and chewing tobacco. If you need help quitting, ask your health care provider.  Get plenty of rest.  If directed, check your blood sugar at home as told by your health care provider.  Keep all follow-up visits as told  by your health care provider. This is important. Contact a health care provider if you:  Do not recover as quickly as expected.  Develop new or worsening symptoms.  Have persistent pain, weakness, or nausea.  Recover and then have another episode of pain.  Have a fever. Get help right away if:  You cannot eat or keep fluids down.  Your pain becomes severe.  Your skin or the white part of your eyes turns yellow (jaundice).  You have sudden swelling in your abdomen.  You vomit.  You feel dizzy or you faint.  Your blood sugar is high (over 300 mg/dL). Summary  Acute pancreatitis happens when inflammation of the pancreas suddenly occurs and the pancreas becomes irritated and swollen.  This condition is typically caused by alcohol abuse, drug abuse, or gallstones.  Treatment for this condition usually requires a stay in the hospital. This information is not intended to replace advice given to you by your health care provider. Make sure you discuss any questions you have with your health care provider. Document Released: 11/27/2005 Document Revised: 09/16/2018 Document Reviewed: 06/03/2018 Elsevier Patient Education   2020 [employer].   Cholelithiasis  Cholelithiasis is a form of gallbladder disease in which gallstones form in the gallbladder. The gallbladder is an organ that stores bile. Bile is made in the liver, and it helps to digest fats. Gallstones begin as small crystals and slowly grow into stones. They may cause no symptoms until the gallbladder tightens (contracts) and a gallstone is blocking the duct (gallbladder attack), which can cause pain. Cholelithiasis is also referred to as gallstones. There are two main types of gallstones:  Cholesterol stones. These are made of hardened cholesterol and are usually yellow-green in color. They are the most common type of gallstone. Cholesterol is a white, waxy, fat-like substance that is made in the liver.  Pigment stones. These are dark in color and are made of a red-yellow substance that forms when hemoglobin from red blood cells breaks down (bilirubin). What are the causes? This condition may be caused by an imbalance in the substances that bile is made of. This can happen if the bile:  Has too much bilirubin.  Has too much cholesterol.  Does not have enough bile salts. These salts help the body absorb and digest fats. In some cases, this condition can also be caused by the gallbladder not emptying completely or often enough. What increases the risk? The following factors may make you more likely to develop this condition:  Being male.  Having multiple pregnancies. Health care providers sometimes advise removing diseased gallbladders before future pregnancies.  Eating a diet that is heavy in fried foods, fat, and refined carbohydrates, like white bread and white rice.  Being obese.  Being older than age 82.  Prolonged use of medicines that contain male hormones (estrogen).  Having diabetes mellitus.  Rapidly losing weight.  Having a family history of gallstones.  Being of [place] or [place] descent.  Having an  intestinal disease such as Crohn disease.  Having metabolic syndrome.  Having cirrhosis.  Having severe types of anemia such as sickle cell anemia. What are the signs or symptoms? In most cases, there are no symptoms. These are known as silent gallstones. If a gallstone blocks the bile ducts, it can cause a gallbladder attack. The main symptom of a gallbladder attack is sudden pain in the upper right abdomen. The pain usually comes at night or after eating a large meal. The pain can  last for one or several hours and can spread to the right shoulder or chest. If the bile duct is blocked for more than a few hours, it can cause infection or inflammation of the gallbladder, liver, or pancreas, which may cause:  Nausea.  Vomiting.  Abdominal pain that lasts for 5 hours or more.  Fever or chills.  Yellowing of the skin or the whites of the eyes (jaundice).  Dark urine.  Light-colored stools. How is this diagnosed? This condition may be diagnosed based on:  A physical exam.  Your medical history.  An ultrasound of your gallbladder.  CT scan.  MRI.  Blood tests to check for signs of infection or inflammation.  A scan of your gallbladder and bile ducts (biliary system) using nonharmful radioactive material and special cameras that can see the radioactive material (cholescintigram). This test checks to see how your gallbladder contracts and whether bile ducts are blocked.  Inserting a small tube with a camera on the end (endoscope) through your mouth to inspect bile ducts and check for blockages (endoscopic retrograde cholangiopancreatogram). How is this treated? Treatment for gallstones depends on the severity of the condition. Silent gallstones do not need treatment. If the gallstones cause a gallbladder attack or other symptoms, treatment may be required. Options for treatment include:  Surgery to remove the gallbladder (cholecystectomy). This is the most common  treatment.  Medicines to dissolve gallstones. These are most effective at treating small gallstones. You may need to take medicines for up to 6-12 months.  Shock wave treatment (extracorporeal biliary lithotripsy). In this treatment, an ultrasound machine sends shock waves to the gallbladder to break gallstones into smaller pieces. These pieces can then be passed into the intestines or be dissolved by medicine. This is rarely used.  Removing gallstones through endoscopic retrograde cholangiopancreatogram. A small basket can be attached to the endoscope and used to capture and remove gallstones. Follow these instructions at home:  Take over-the-counter and prescription medicines only as told by your health care provider.  Maintain a healthy weight and follow a healthy diet. This includes: ? Reducing fatty foods, such as fried food. ? Reducing refined carbohydrates, like white bread and white rice. ? Increasing fiber. Aim for foods like almonds, fruit, and beans.  Keep all follow-up visits as told by your health care provider. This is important. Contact a health care provider if:  You think you have had a gallbladder attack.  You have been diagnosed with silent gallstones and you develop abdominal pain or indigestion. Get help right away if:  You have pain from a gallbladder attack that lasts for more than 2 hours.  You have abdominal pain that lasts for more than 5 hours.  You have a fever or chills.  You have persistent nausea and vomiting.  You develop jaundice.  You have dark urine or light-colored stools. Summary  Cholelithiasis (also called gallstones) is a form of gallbladder disease in which gallstones form in the gallbladder.  This condition is caused by an imbalance in the substances that make up bile. This can happen if the bile has too much cholesterol, too much bilirubin, or not enough bile salts.  You are more likely to develop this condition if you are male,  pregnant, using medicines with estrogen, obese, older than age 31, or have a family history of gallstones. You may also develop gallstones if you have diabetes, an intestinal disease, cirrhosis, or metabolic syndrome.  Treatment for gallstones depends on the severity of the condition.  Silent gallstones do not need treatment.  If gallstones cause a gallbladder attack or other symptoms, treatment may be needed. The most common treatment is surgery to remove the gallbladder. This information is not intended to replace advice given to you by your health care provider. Make sure you discuss any questions you have with your health care provider. Document Released: 11/23/2005 Document Revised: 11/09/2017 Document Reviewed: 08/13/2016 Elsevier Patient Education  2020 [employer].

## 2021-10-27 IMAGING — CT CT ABD-PELV W/ CM
2 of 6 series · 16 of 46 positions shown, 18 images · IV contrast (omnipaque)
Comparison: None.

CLINICAL DATA: Nausea, vomiting.

EXAM:
CT ABDOMEN AND PELVIS WITH CONTRAST
TECHNIQUE: Multidetector CT imaging of the abdomen and pelvis was performed
using the standard protocol following bolus administration of
intravenous contrast.
CONTRAST:  100mL OMNIPAQUE IOHEXOL 300 MG/ML  SOLN

[Series 3: routine abd/pel with · axial · 0.91mm/px · z∈[-873,-488]mm · 13 of 89 slices shown, 15 images]
[im 6/89  soft-tissue]
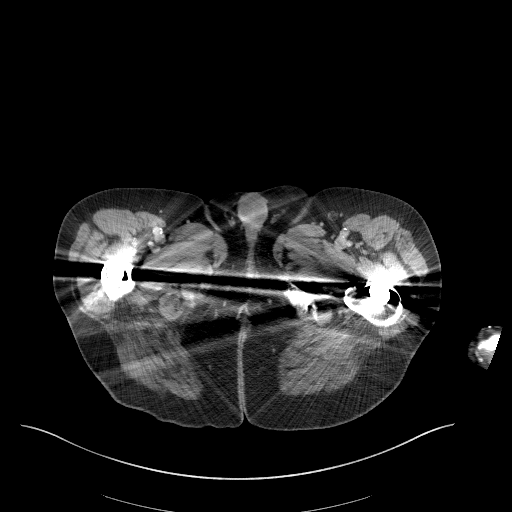
[im 6/89  bone]
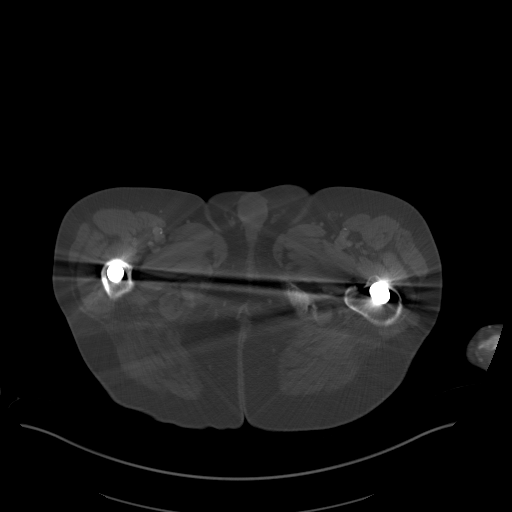
[im 12/89  soft-tissue]
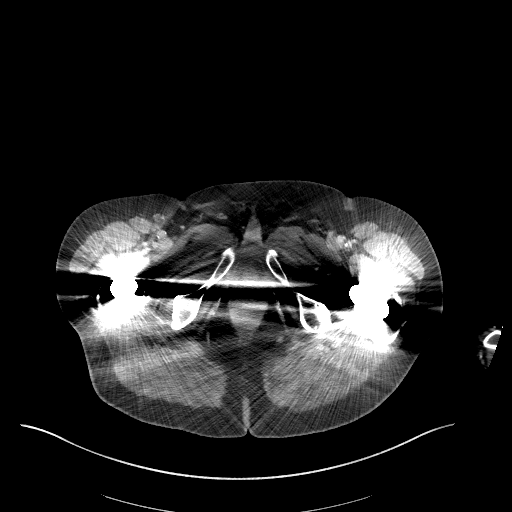
[im 24/89  soft-tissue]
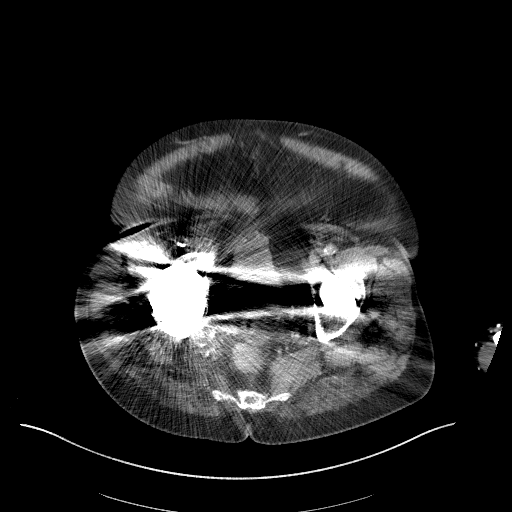
[im 30/89  soft-tissue]
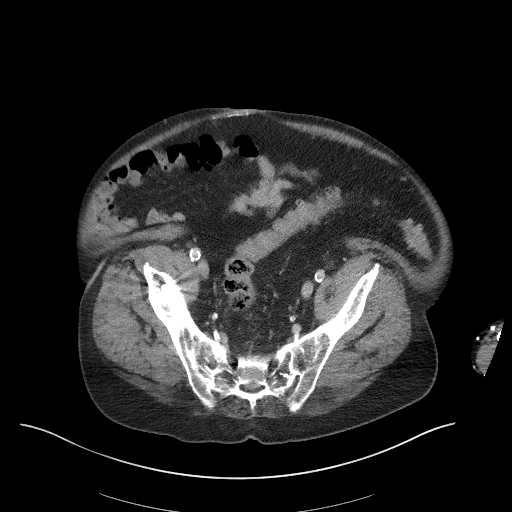
[im 36/89  soft-tissue]
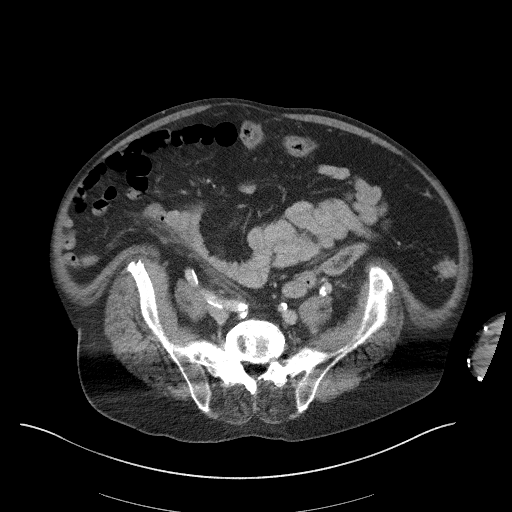
[im 42/89  soft-tissue]
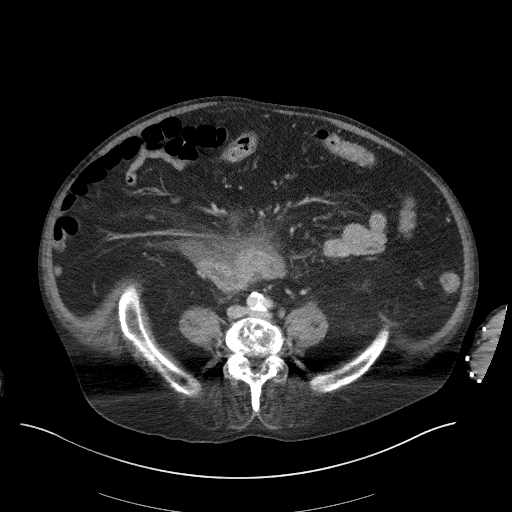
[im 47/89  soft-tissue]
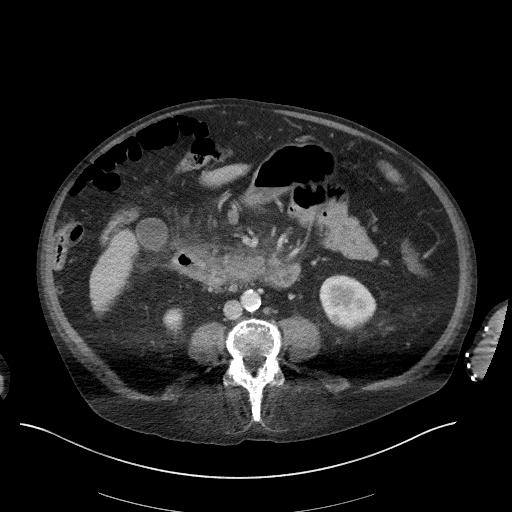
[im 53/89  soft-tissue]
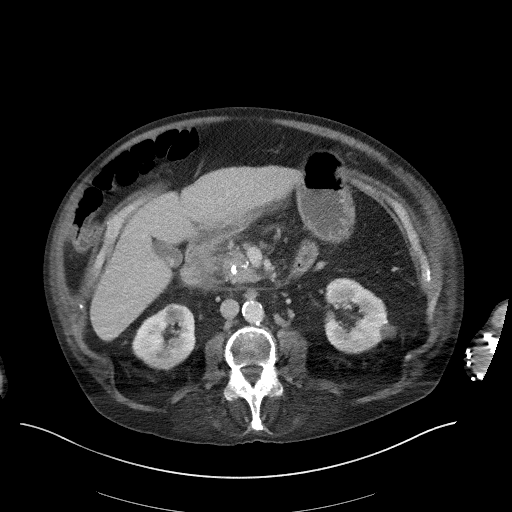
[im 59/89  soft-tissue]
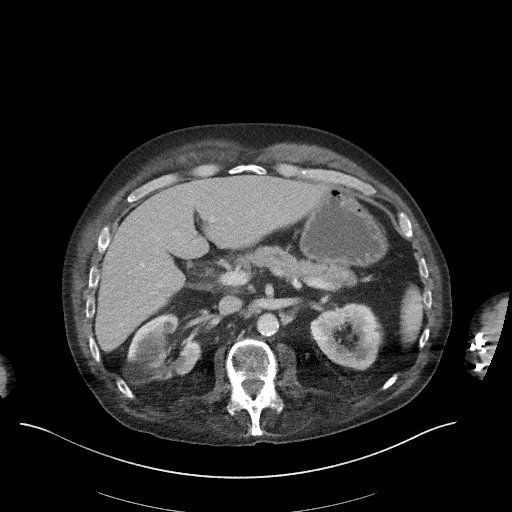
[im 59/89  bone]
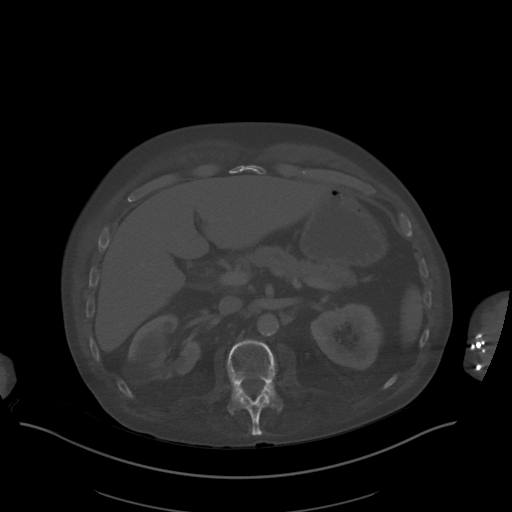
[im 65/89  soft-tissue]
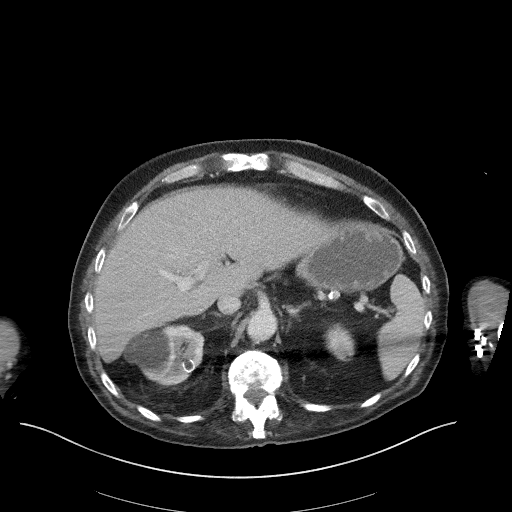
[im 71/89  soft-tissue]
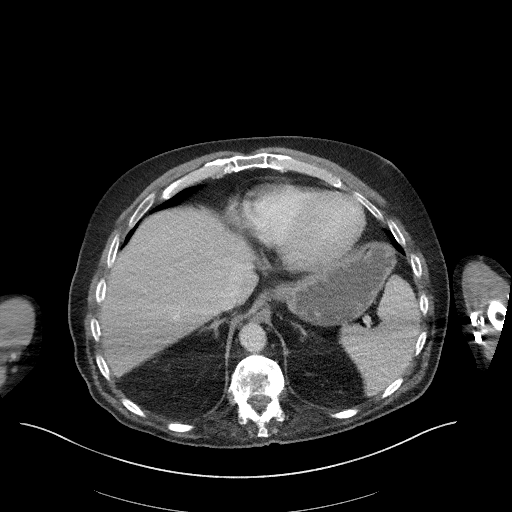
[im 77/89  soft-tissue]
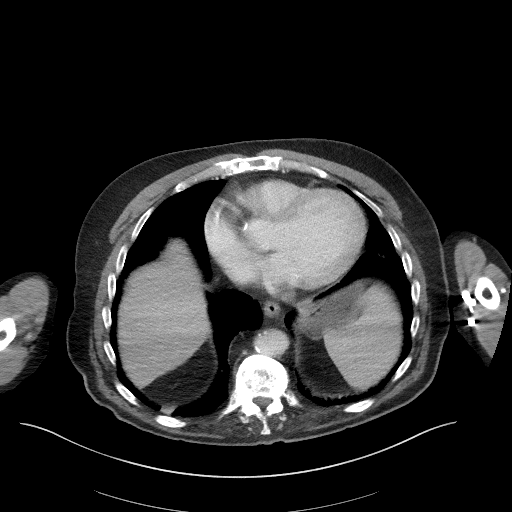
[im 83/89  soft-tissue]
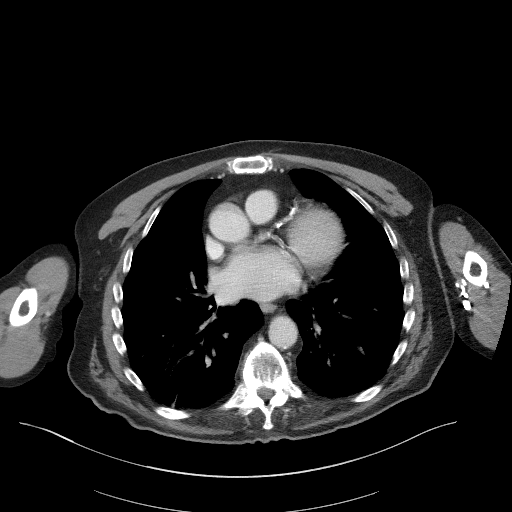

[Series 9: coronal st · coronal · 0.90mm/px · 3 of 118 slices shown]
[im 40/118  soft-tissue]
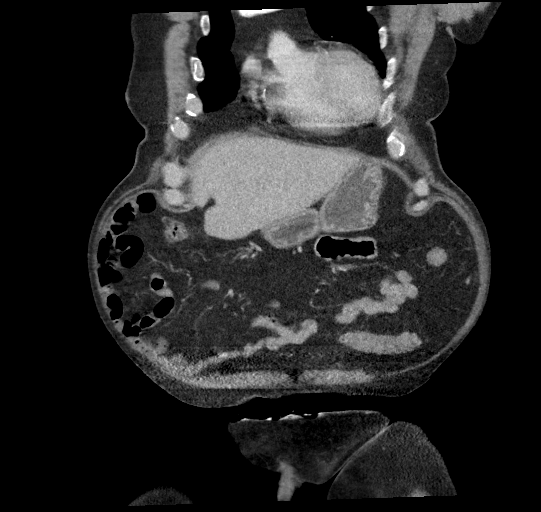
[im 53/118  soft-tissue]
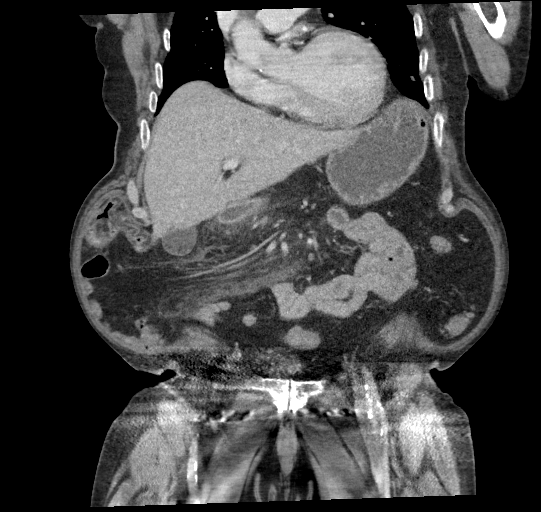
[im 66/118  soft-tissue]
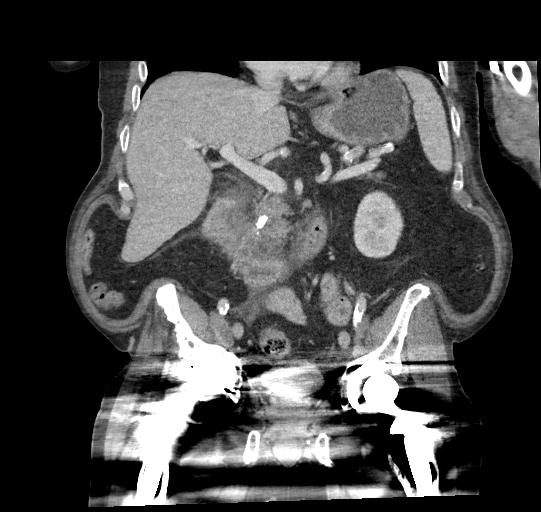

[16 of 46 positions shown; findings below may reference images not displayed]

FINDINGS: Lower chest: Bibasilar atelectasis. Coronary artery calcifications.
Heart is borderline in size.

Hepatobiliary: No focal hepatic abnormality. 11 mm gallstone
layering within the gallbladder.

Pancreas: Calcifications within the pancreatic head compatible with
chronic pancreatitis. There is inflammation around the pancreatic
head and uncinate process compatible with acute pancreatitis.
Pancreatic duct is mildly prominent at 5 mm.

Spleen: No focal abnormality.  Normal size.

Adrenals/Urinary Tract: Bilateral renal cysts. No hydronephrosis.
Adrenal glands unremarkable. Urinary bladder obscured by beam
hardening artifact from bilateral hip replacements.

Stomach/Bowel: Scattered left colonic diverticulosis. No active
diverticulitis. Stomach and small bowel decompressed, unremarkable.

Vascular/Lymphatic: Heavily calcified aorta and iliac vessels. No
evidence of aneurysm or adenopathy.

Reproductive: Lower pelvis obscured by beam hardening artifact.

Other: No free fluid or free air.

Musculoskeletal: Bilateral hip replacements. No acute bony
abnormality.
IMPRESSION: Inflammation around the pancreatic head, uncinate process and
duodenal sweep most compatible with acute pancreatitis.

Calcifications within the pancreatic head compatible with chronic
pancreatitis.

Bilateral renal cysts.

Scattered left colonic diverticulosis.  No active diverticulitis.

Aortic atherosclerosis.

Cholelithiasis.

## 2022-08-24 ENCOUNTER — Other Ambulatory Visit: Payer: Self-pay | Admitting: Nephrology

## 2022-08-24 DIAGNOSIS — N1832 Chronic kidney disease, stage 3b: Secondary | ICD-10-CM

## 2022-09-04 ENCOUNTER — Ambulatory Visit
Admission: RE | Admit: 2022-09-04 | Discharge: 2022-09-04 | Disposition: A | Discharge status: Home / Self Care | Payer: Medicare Other | Source: Ambulatory Visit | Attending: Nephrology | Admitting: Nephrology

## 2022-09-04 DIAGNOSIS — E1122 Type 2 diabetes mellitus with diabetic chronic kidney disease: Secondary | ICD-10-CM | POA: Diagnosis not present

## 2022-09-04 DIAGNOSIS — N1832 Chronic kidney disease, stage 3b: Secondary | ICD-10-CM | POA: Insufficient documentation

## 2022-10-23 ENCOUNTER — Ambulatory Visit: Payer: Medicare Other

## 2024-05-26 ENCOUNTER — Emergency Department

## 2024-05-26 ENCOUNTER — Other Ambulatory Visit: Payer: Self-pay

## 2024-05-26 ENCOUNTER — Inpatient Hospital Stay
Admission: EM | Admit: 2024-05-26 | Discharge: 2024-05-31 | DRG: 286 | Disposition: A | Discharge status: Home / Self Care | Attending: Internal Medicine | Admitting: Internal Medicine

## 2024-05-26 DIAGNOSIS — D539 Nutritional anemia, unspecified: Secondary | ICD-10-CM | POA: Diagnosis present

## 2024-05-26 DIAGNOSIS — E785 Hyperlipidemia, unspecified: Secondary | ICD-10-CM | POA: Diagnosis present

## 2024-05-26 DIAGNOSIS — Z604 Social exclusion and rejection: Secondary | ICD-10-CM | POA: Diagnosis present

## 2024-05-26 DIAGNOSIS — Z79891 Long term (current) use of opiate analgesic: Secondary | ICD-10-CM

## 2024-05-26 DIAGNOSIS — M459 Ankylosing spondylitis of unspecified sites in spine: Secondary | ICD-10-CM | POA: Diagnosis present

## 2024-05-26 DIAGNOSIS — M1990 Unspecified osteoarthritis, unspecified site: Secondary | ICD-10-CM | POA: Diagnosis present

## 2024-05-26 DIAGNOSIS — I5021 Acute systolic (congestive) heart failure: Secondary | ICD-10-CM | POA: Insufficient documentation

## 2024-05-26 DIAGNOSIS — I4719 Other supraventricular tachycardia: Secondary | ICD-10-CM | POA: Diagnosis present

## 2024-05-26 DIAGNOSIS — Z96643 Presence of artificial hip joint, bilateral: Secondary | ICD-10-CM | POA: Diagnosis present

## 2024-05-26 DIAGNOSIS — Z87892 Personal history of anaphylaxis: Secondary | ICD-10-CM

## 2024-05-26 DIAGNOSIS — I2489 Other forms of acute ischemic heart disease: Secondary | ICD-10-CM | POA: Diagnosis present

## 2024-05-26 DIAGNOSIS — I5023 Acute on chronic systolic (congestive) heart failure: Secondary | ICD-10-CM | POA: Insufficient documentation

## 2024-05-26 DIAGNOSIS — Z79899 Other long term (current) drug therapy: Secondary | ICD-10-CM | POA: Diagnosis not present

## 2024-05-26 DIAGNOSIS — J90 Pleural effusion, not elsewhere classified: Secondary | ICD-10-CM

## 2024-05-26 DIAGNOSIS — E8721 Acute metabolic acidosis: Secondary | ICD-10-CM | POA: Diagnosis present

## 2024-05-26 DIAGNOSIS — R0602 Shortness of breath: Principal | ICD-10-CM

## 2024-05-26 DIAGNOSIS — Z515 Encounter for palliative care: Secondary | ICD-10-CM | POA: Diagnosis not present

## 2024-05-26 DIAGNOSIS — I428 Other cardiomyopathies: Secondary | ICD-10-CM | POA: Diagnosis not present

## 2024-05-26 DIAGNOSIS — E8770 Fluid overload, unspecified: Secondary | ICD-10-CM | POA: Diagnosis present

## 2024-05-26 DIAGNOSIS — D7589 Other specified diseases of blood and blood-forming organs: Secondary | ICD-10-CM | POA: Diagnosis present

## 2024-05-26 DIAGNOSIS — Z833 Family history of diabetes mellitus: Secondary | ICD-10-CM

## 2024-05-26 DIAGNOSIS — I2510 Atherosclerotic heart disease of native coronary artery without angina pectoris: Secondary | ICD-10-CM | POA: Diagnosis present

## 2024-05-26 DIAGNOSIS — Z7982 Long term (current) use of aspirin: Secondary | ICD-10-CM

## 2024-05-26 DIAGNOSIS — Z720 Tobacco use: Secondary | ICD-10-CM | POA: Diagnosis not present

## 2024-05-26 DIAGNOSIS — N184 Chronic kidney disease, stage 4 (severe): Secondary | ICD-10-CM | POA: Diagnosis present

## 2024-05-26 DIAGNOSIS — I130 Hypertensive heart and chronic kidney disease with heart failure and stage 1 through stage 4 chronic kidney disease, or unspecified chronic kidney disease: Principal | ICD-10-CM | POA: Diagnosis present

## 2024-05-26 DIAGNOSIS — N189 Chronic kidney disease, unspecified: Secondary | ICD-10-CM | POA: Diagnosis not present

## 2024-05-26 DIAGNOSIS — I10 Essential (primary) hypertension: Secondary | ICD-10-CM | POA: Diagnosis present

## 2024-05-26 DIAGNOSIS — I420 Dilated cardiomyopathy: Secondary | ICD-10-CM | POA: Diagnosis present

## 2024-05-26 DIAGNOSIS — M40209 Unspecified kyphosis, site unspecified: Secondary | ICD-10-CM | POA: Diagnosis present

## 2024-05-26 DIAGNOSIS — E1122 Type 2 diabetes mellitus with diabetic chronic kidney disease: Secondary | ICD-10-CM | POA: Diagnosis present

## 2024-05-26 DIAGNOSIS — J449 Chronic obstructive pulmonary disease, unspecified: Secondary | ICD-10-CM | POA: Diagnosis present

## 2024-05-26 DIAGNOSIS — F1010 Alcohol abuse, uncomplicated: Secondary | ICD-10-CM | POA: Diagnosis present

## 2024-05-26 DIAGNOSIS — N179 Acute kidney failure, unspecified: Secondary | ICD-10-CM | POA: Diagnosis present

## 2024-05-26 DIAGNOSIS — I509 Heart failure, unspecified: Secondary | ICD-10-CM | POA: Diagnosis not present

## 2024-05-26 DIAGNOSIS — R600 Localized edema: Secondary | ICD-10-CM

## 2024-05-26 DIAGNOSIS — Z885 Allergy status to narcotic agent status: Secondary | ICD-10-CM

## 2024-05-26 DIAGNOSIS — Z886 Allergy status to analgesic agent status: Secondary | ICD-10-CM

## 2024-05-26 DIAGNOSIS — D631 Anemia in chronic kidney disease: Secondary | ICD-10-CM | POA: Diagnosis present

## 2024-05-26 DIAGNOSIS — F17200 Nicotine dependence, unspecified, uncomplicated: Secondary | ICD-10-CM | POA: Diagnosis present

## 2024-05-26 DIAGNOSIS — E875 Hyperkalemia: Secondary | ICD-10-CM | POA: Diagnosis present

## 2024-05-26 DIAGNOSIS — Z888 Allergy status to other drugs, medicaments and biological substances status: Secondary | ICD-10-CM

## 2024-05-26 DIAGNOSIS — I5031 Acute diastolic (congestive) heart failure: Secondary | ICD-10-CM | POA: Diagnosis not present

## 2024-05-26 HISTORY — DX: Chronic kidney disease, stage 4 (severe): N18.4

## 2024-05-26 HISTORY — DX: Atherosclerotic heart disease of native coronary artery without angina pectoris: I25.10

## 2024-05-26 HISTORY — DX: Atherosclerosis of aorta: I70.0

## 2024-05-26 HISTORY — DX: Nutritional anemia, unspecified: D53.9

## 2024-05-26 LAB — TROPONIN I (HIGH SENSITIVITY)
Troponin I (High Sensitivity): 59 ng/L — ABNORMAL HIGH (ref <18)
Troponin I (High Sensitivity): 57 ng/L — ABNORMAL HIGH (ref <18)
Troponin I (High Sensitivity): 68 ng/L — ABNORMAL HIGH (ref <18)
Troponin I (High Sensitivity): 69 ng/L — ABNORMAL HIGH (ref <18)

## 2024-05-26 LAB — BASIC METABOLIC PANEL WITH GFR
Anion gap: 11 (ref 5–15)
BUN: 51 mg/dL — ABNORMAL HIGH (ref 8–23)
CO2: 18 mmol/L — ABNORMAL LOW (ref 22–32)
Calcium: 8.1 mg/dL — ABNORMAL LOW (ref 8.9–10.3)
Chloride: 110 mmol/L (ref 98–111)
Creatinine, Ser: 2.66 mg/dL — ABNORMAL HIGH (ref 0.61–1.24)
GFR, Estimated: 26 mL/min — ABNORMAL LOW (ref >60)
Glucose, Bld: 171 mg/dL — ABNORMAL HIGH (ref 70–99)
Potassium: 5.3 mmol/L — ABNORMAL HIGH (ref 3.5–5.1)
Sodium: 139 mmol/L (ref 135–145)

## 2024-05-26 LAB — CREATININE, SERUM
Creatinine, Ser: 2.78 mg/dL — ABNORMAL HIGH (ref 0.61–1.24)
GFR, Estimated: 24 mL/min — ABNORMAL LOW (ref >60)

## 2024-05-26 LAB — CBC
HCT: 34.1 % — ABNORMAL LOW (ref 39.0–52.0)
HCT: 31.3 % — ABNORMAL LOW (ref 39.0–52.0)
Hemoglobin: 10.8 g/dL — ABNORMAL LOW (ref 13.0–17.0)
Hemoglobin: 9.9 g/dL — ABNORMAL LOW (ref 13.0–17.0)
MCH: 32 pg (ref 26.0–34.0)
MCH: 31.9 pg (ref 26.0–34.0)
MCHC: 31.7 g/dL (ref 30.0–36.0)
MCHC: 31.6 g/dL (ref 30.0–36.0)
MCV: 100.9 fL — ABNORMAL HIGH (ref 80.0–100.0)
MCV: 101 fL — ABNORMAL HIGH (ref 80.0–100.0)
Platelets: 291 10*3/uL (ref 150–400)
Platelets: 273 10*3/uL (ref 150–400)
RBC: 3.38 MIL/uL — ABNORMAL LOW (ref 4.22–5.81)
RBC: 3.1 MIL/uL — ABNORMAL LOW (ref 4.22–5.81)
RDW: 14 % (ref 11.5–15.5)
RDW: 14 % (ref 11.5–15.5)
WBC: 8 10*3/uL (ref 4.0–10.5)
WBC: 10 10*3/uL (ref 4.0–10.5)
nRBC: 0 % (ref 0.0–0.2)
nRBC: 0 % (ref 0.0–0.2)

## 2024-05-26 LAB — BRAIN NATRIURETIC PEPTIDE: B Natriuretic Peptide: 3571.6 pg/mL — ABNORMAL HIGH (ref 0.0–100.0)

## 2024-05-26 MED ORDER — METOPROLOL SUCCINATE ER 25 MG PO TB24
25.0000 mg | ORAL_TABLET | Freq: Every day | ORAL | Status: DC
  Administered 2024-05-27 – 2024-05-28 (×2): 25 mg via ORAL
  Filled 2024-05-26 (×2): qty 1

## 2024-05-26 MED ORDER — FUROSEMIDE 10 MG/ML IJ SOLN
40.0000 mg | Freq: Two times a day (BID) | INTRAMUSCULAR | Status: DC
  Administered 2024-05-26 – 2024-05-28 (×4): 40 mg via INTRAVENOUS
  Filled 2024-05-26 (×4): qty 4

## 2024-05-26 MED ORDER — SODIUM ZIRCONIUM CYCLOSILICATE 10 G PO PACK
10.0000 g | PACK | Freq: Once | ORAL | Status: AC
  Administered 2024-05-26: 10 g via ORAL
  Filled 2024-05-26: qty 1

## 2024-05-26 MED ORDER — FUROSEMIDE 10 MG/ML IJ SOLN
20.0000 mg | Freq: Once | INTRAMUSCULAR | Status: AC
  Administered 2024-05-26: 20 mg via INTRAVENOUS
  Filled 2024-05-26: qty 4

## 2024-05-26 MED ORDER — NICOTINE 21 MG/24HR TD PT24
21.0000 mg | MEDICATED_PATCH | Freq: Every day | TRANSDERMAL | Status: DC
  Administered 2024-05-26 – 2024-05-30 (×5): 21 mg via TRANSDERMAL
  Filled 2024-05-26 (×6): qty 1

## 2024-05-26 MED ORDER — SULFASALAZINE 500 MG PO TBEC
500.0000 mg | DELAYED_RELEASE_TABLET | Freq: Two times a day (BID) | ORAL | Status: DC
  Administered 2024-05-26 – 2024-05-31 (×10): 500 mg via ORAL
  Filled 2024-05-26 (×11): qty 1

## 2024-05-26 MED ORDER — ASPIRIN 81 MG PO TBEC
81.0000 mg | DELAYED_RELEASE_TABLET | ORAL | Status: DC
  Administered 2024-05-26 – 2024-05-30 (×3): 81 mg via ORAL
  Filled 2024-05-26 (×5): qty 1

## 2024-05-26 MED ORDER — OXYCODONE HCL 5 MG PO TABS
30.0000 mg | ORAL_TABLET | Freq: Every day | ORAL | Status: DC
  Administered 2024-05-26 – 2024-05-31 (×16): 30 mg via ORAL
  Filled 2024-05-26 (×18): qty 6

## 2024-05-26 MED ORDER — HEPARIN SODIUM (PORCINE) 5000 UNIT/ML IJ SOLN
5000.0000 [IU] | Freq: Three times a day (TID) | INTRAMUSCULAR | Status: DC
  Administered 2024-05-26 – 2024-05-31 (×13): 5000 [IU] via SUBCUTANEOUS
  Filled 2024-05-26 (×15): qty 1

## 2024-05-26 NOTE — ED Triage Notes (Addendum)
 Pt comes with abnormal labs. Pt states his K is thru the roof. Pt states ankle swelling and trouble breathing. Pt has been vomiting and increased fatigue.   K was 6.4 on June 11

## 2024-05-26 NOTE — H&P (Signed)
 History and Physical    Victor Willis LOV:[id number] DOB: [birth year] DOA: 05/26/2024  DOS: the patient was seen and examined on 05/26/2024  PCP: Pcp, No   Patient coming from: Home  I have personally briefly reviewed patient's old medical records in [facility]  Chief Complaint: Abnormal labs with worsening potassium at 6.4 on May 21, 2024  HPI: Victor Willis is a pleasant 68 y.o. male with medical history significant for HTN, recent hip surgery, alcohol use disorder, CKD who was asked to go to the emergency room by orthopedic physician who was following him after hip surgery.  Patient is stated that his potassium on June 11 was 6.4 and he was asked to go to the emergency room for evaluation.  Patient came to the emergency room for that.  He was complaining of shortness of breath and lower extremity swelling for the last few days.  Patient daughter stated to emergency room physician that patient has been having worsening fatigue, some nausea and vomiting.  Patient denied any chest pain, dysuria, diarrhea, palpitations.  Patient follows Dr. Erminio Willis and his last GFR was 40.   ED Course: Upon arrival to the ED, patient is found to be tachycardic at 140, potassium level of 5.3 creatinine of 2.66 and BUN 51, leg swelling and slightly elevated troponin at 59 and 57 with sinus tachycardia on EKG, bilateral pleural effusion on x-ray.  Hospitalist service was consulted for evaluation for admission for fluid overload and abnormal potassium level.  Review of Systems:  ROS  All other systems negative except as noted in the HPI.  Past Medical History:  Diagnosis Date   Alcohol abuse    Ankylosing spondylitis (HCC)    Hypertension     Past Surgical History:  Procedure Laterality Date   BACK SURGERY     TOTAL HIP ARTHROPLASTY Bilateral      reports that he has been smoking. He has never used smokeless tobacco. He reports current alcohol use. No history on file for drug  use.  Allergies  Allergen Reactions   Celecoxib Rash, Anaphylaxis and Dermatitis   Atorvastatin Other (See Comments)    fatigue   Morphine  Other (See Comments)    PT STATE IT MAKE HIM FEEL WAY TOO LOOPY, CAN TOLERATE DILAUDID  VERY WELL     PT CAN ALSO TOLERATE HYDROCODONE VERY WELL   Prednisone Other (See Comments)    Throat swelling per patient    Family History  Problem Relation Age of Onset   Diabetes Mellitus II Mother    Diabetes Mellitus II Father    Ankylosing spondylitis Daughter     Prior to Admission medications   Medication Sig Start Date End Date Taking? Authorizing Provider  aspirin EC 81 MG tablet Take 81 mg by mouth every other day.     [provider]  cyclobenzaprine (FLEXERIL) 10 MG tablet Take 10 mg by mouth 2 (two) times daily. 09/19/19   [provider]  lisinopril (ZESTRIL) 40 MG tablet Take 40 mg by mouth daily. 10/08/19   [provider]  metoprolol  succinate (TOPROL  XL) 25 MG 24 hr tablet Take 1 tablet (25 mg total) by mouth daily. 10/31/19 10/30/20  Krishnan, Sendil K, MD  Multiple Vitamins-Minerals (MULTIVITAMIN GUMMIES ADULT PO) Take 2 each by mouth daily.    [provider]  oxycodone  (ROXICODONE ) 30 MG immediate release tablet Take 30 mg by mouth 5 (five) times daily. 10/23/19   [provider]  sulfaSALAzine  (AZULFIDINE ) 500 MG tablet  Take 500 mg by mouth 2 (two) times daily. 09/19/19   [provider]    Physical Exam: Vitals:   05/26/24 1409 05/26/24 1410 05/26/24 1822  BP:  132/89   Pulse:  (!) 120   Resp:  20   Temp:  98 F (36.7 C) 98.1 F (36.7 C)  TempSrc:   Oral  SpO2:  100%   Weight: 59 kg    Height: 5' (1.524 m)      Physical Exam   Labs on Admission: I have personally reviewed following labs and imaging studies  CBC: Recent Labs  Lab 05/26/24 1414  WBC 8.0  HGB 10.8*  HCT 34.1*  MCV 100.9*  PLT 291   Basic Metabolic Panel: Recent Labs  Lab 05/26/24 1414  NA 139   K 5.3*  CL 110  CO2 18*  GLUCOSE 171*  BUN 51*  CREATININE 2.66*  CALCIUM 8.1*   GFR: Estimated Creatinine Clearance: 19.1 mL/min (A) (by C-G formula based on SCr of 2.66 mg/dL (H)). Liver Function Tests: No results for input(s): AST, ALT, ALKPHOS, BILITOT, PROT, ALBUMIN in the last 168 hours. No results for input(s): LIPASE, AMYLASE in the last 168 hours. No results for input(s): AMMONIA in the last 168 hours. Coagulation Profile: No results for input(s): INR, PROTIME in the last 168 hours. Cardiac Enzymes: Recent Labs  Lab 05/26/24 1414 05/26/24 1624  TROPONINIHS 59* 57*   BNP (last 3 results) Recent Labs    05/26/24 1414  BNP 3,571.6*   HbA1C: No results for input(s): HGBA1C in the last 72 hours. CBG: No results for input(s): GLUCAP in the last 168 hours. Lipid Profile: No results for input(s): CHOL, HDL, LDLCALC, TRIG, CHOLHDL, LDLDIRECT in the last 72 hours. Thyroid Function Tests: No results for input(s): TSH, T4TOTAL, FREET4, T3FREE, THYROIDAB in the last 72 hours. Anemia Panel: No results for input(s): VITAMINB12, FOLATE, FERRITIN, TIBC, IRON, RETICCTPCT in the last 72 hours. Urine analysis:    Component Value Date/Time   COLORURINE YELLOW (A) 10/28/2019 2317   APPEARANCEUR CLEAR (A) 10/28/2019 2317   LABSPEC 1.023 10/28/2019 2317   PHURINE 6.0 10/28/2019 2317   GLUCOSEU 50 (A) 10/28/2019 2317   HGBUR NEGATIVE 10/28/2019 2317   BILIRUBINUR NEGATIVE 10/28/2019 2317   KETONESUR 5 (A) 10/28/2019 2317   PROTEINUR >=300 (A) 10/28/2019 2317   NITRITE NEGATIVE 10/28/2019 2317   LEUKOCYTESUR NEGATIVE 10/28/2019 2317    Radiological Exams on Admission: I have personally reviewed images DG Chest 2 View Result Date: 05/26/2024 CLINICAL DATA:  Shortness of breath EXAM: CHEST - 2 VIEW COMPARISON:  None Available. FINDINGS: Underinflation. Small bilateral pleural effusions are seen,  right-greater-than-left adjacent opacities. Enlarged cardiopericardial silhouette. Tortuous ectatic aorta. Interstitial prominence noted. No pneumothorax. Frontal view is rotated to the right. Osteopenia. Diffuse bridging syndesmophytes. Air-fluid level along the stomach beneath the left hemidiaphragm. IMPRESSION: Hyperinflation. Pleural effusions, right-greater-than-left. Enlarged heart. Electronically Signed   By: Adrianna Horde M.D.   On: 05/26/2024 14:51    EKG: My personal interpretation of EKG shows: Sinus tachycardia at 119 bpm with no ST elevation.    Assessment/Plan Principal Problem:   Fluid overload Active Problems:   Essential hypertension   Alcohol abuse   Tobacco abuse   AKI (acute kidney injury) (HCC)     This is a 68 year old male who has a history of HTN, CKD, alcohol and tobacco use who was brought into the emergency room for abnormal potassium, progressively worsening shortness of breath and leg swelling.  1.  Acute on chronic kidney disease - Patient was told he had CKD but his kidney numbers were better before with GFR estimated around 40. - It appears that his GFR has significantly decreased and has leg swelling. - He had hyperkalemia to 6.4 on 05/21/2024 now it is 5.3, without any EKG changes. - Will continue to monitor his kidney functions. - Nephrology consult have been requested  2.  Fluid overload-CHF - Will get echocardiogram - Continue Lasix 40 mg twice daily - Will monitor his kidney function.  3.  Hypertension - Blood pressure remains to be fairly controlled. - Continue to monitor blood pressure - Will continue his metoprolol  and aspirin - Will hold off lisinopril-hydrochlorothiazide combination due to AKI  4.  Alcohol/smoking - Extensive counseling was done  5.  Elevated troponin - May be related to demand ischemia - Will get echocardiogram - Continue to trend troponin - May consider cardiology consult if troponins were to go up  6.   Hyperkalemia - Patient received Lokelma in the emergency room. - Current potassium is 5.3 - Continue to monitor potassium level     DVT prophylaxis: SQ Heparin  Code Status: Full Code Family Communication: Son  Disposition Plan: Home  Consults called: Nephrology  Admission status: Inpatient, Telemetry bed   Beatris Bough, MD [facility] 05/26/2024, 6:58 PM

## 2024-05-26 NOTE — ED Notes (Signed)
 Full rainbow sent to lab.

## 2024-05-26 NOTE — ED Provider Notes (Signed)
 [facility] Provider Note    Event Date/Time   First MD Initiated Contact with Patient 05/26/24 1607     (approximate)   History   abnormal labs   HPI  Victor Willis is a 68 y.o. male with history of CKD, hypertension, presenting with shortness of breath and lower extremity swelling.  Per patient and daughter, he has been having worsening fatigue, nausea vomiting, he denies any chest pain, dysuria, diarrhea.  States that he noticed decrease in urination last couple days.  Does have history of CKD and was told by Dr. Erminio Hazy that his GFR is 40.  Was sent in because he was noted to have a potassium of 6.4 on June 11.  Independent she obtained from daughter as above.   On independent review, he had labs done on June 11,  Physical Exam   Triage Vital Signs: ED Triage Vitals  Encounter Vitals Group     BP 05/26/24 1410 132/89     Girls Systolic BP Percentile --      Girls Diastolic BP Percentile --      Boys Systolic BP Percentile --      Boys Diastolic BP Percentile --      Pulse Rate 05/26/24 1410 (!) 120     Resp 05/26/24 1410 20     Temp 05/26/24 1410 98 F (36.7 C)     Temp src --      SpO2 05/26/24 1410 100 %     Weight 05/26/24 1409 130 lb (59 kg)     Height 05/26/24 1409 5' (1.524 m)     Head Circumference --      Peak Flow --      Pain Score 05/26/24 1409 0     Pain Loc --      Pain Education --      Exclude from Growth Chart --     Most recent vital signs: Vitals:   05/26/24 1410  BP: 132/89  Pulse: (!) 120  Resp: 20  Temp: 98 F (36.7 C)  SpO2: 100%     General: Awake, no distress.  CV:  Good peripheral perfusion.  Resp:  Normal effort.  Abd:  No distention.  Soft nontender Other:  Bilateral lower extremity edema the appear symmetrical   ED Results / Procedures / Treatments   Labs (all labs ordered are listed, but only abnormal results are displayed) Labs Reviewed  BASIC METABOLIC PANEL WITH GFR - Abnormal;  Notable for the following components:      Result Value   Potassium 5.3 (*)    CO2 18 (*)    Glucose, Bld 171 (*)    BUN 51 (*)    Creatinine, Ser 2.66 (*)    Calcium 8.1 (*)    GFR, Estimated 26 (*)    All other components within normal limits  CBC - Abnormal; Notable for the following components:   RBC 3.38 (*)    Hemoglobin 10.8 (*)    HCT 34.1 (*)    MCV 100.9 (*)    All other components within normal limits  BRAIN NATRIURETIC PEPTIDE - Abnormal; Notable for the following components:   B Natriuretic Peptide 3,571.6 (*)    All other components within normal limits  TROPONIN I (HIGH SENSITIVITY) - Abnormal; Notable for the following components:   Troponin I (High Sensitivity) 59 (*)    All other components within normal limits  TROPONIN I (HIGH SENSITIVITY) - Abnormal; Notable for the following  components:   Troponin I (High Sensitivity) 57 (*)    All other components within normal limits     EKG  EKG shows, sinus tachycardia, rate 119, normal QS, normal QTc, T wave inversions to lateral leads with ST depressions, no obvious ischemic ST elevation, this is changed compared to prior   RADIOLOGY On my independent interpretation, x-ray showed pleural effusions bilaterally   PROCEDURES:  Critical Care performed: No  Procedures   MEDICATIONS ORDERED IN ED: Medications  furosemide (LASIX) injection 20 mg (has no administration in time range)  sodium zirconium cyclosilicate (LOKELMA) packet 10 g (has no administration in time range)     IMPRESSION / MDM / ASSESSMENT AND PLAN / ED COURSE  I reviewed the triage vital signs and the nursing notes.                              Differential diagnosis includes, but is not limited to, AKI, worsening CKD, volume overload, CHF, viral illness.  Will get labs, EKG, troponin, chest x-ray.  Patient's presentation is most consistent with acute presentation with potential threat to life or bodily function.  Independent  interpretation of labs and imaging below.  Given his AKI, mild hyperkalemia, elevated BNP, he will need to be admitted for further management and nephrology evaluation.  Given that he is still voiding spontaneously, will give him a dose of IV Lasix here as well as Lokelma.  Consult hospitalist was agreeable with the plan for admission and will evaluate the patient.  He is admitted.  The patient is on the cardiac monitor to evaluate for evidence of arrhythmia and/or significant heart rate changes.   Clinical Course as of 05/26/24 1716  Mon May 26, 2024  1635 DG Chest 2 View IMPRESSION: Hyperinflation. Pleural effusions, right-greater-than-left. Enlarged heart.   [TT]  1715 Independent review of labs, no leukocytosis, potassium is mildly elevated, he has an AKI compared to prior, prior creatinine was 2.3 on 6/11, BNP is elevated troponin is mildly elevated. [TT]    Clinical Course User Index [TT] Drenda Gentle, Richard Champion, MD     FINAL CLINICAL IMPRESSION(S) / ED DIAGNOSES   Final diagnoses:  Hypervolemia, unspecified hypervolemia type  Pleural effusion  Lower extremity edema  Shortness of breath     Rx / DC Orders   ED Discharge Orders     None        Note:  This document was prepared using Dragon voice recognition software and may include unintentional dictation errors.    Shane Darling, MD 05/26/24 [id number]

## 2024-05-27 ENCOUNTER — Encounter: Payer: Self-pay | Admitting: Hospitalist

## 2024-05-27 ENCOUNTER — Other Ambulatory Visit (HOSPITAL_COMMUNITY): Payer: Self-pay

## 2024-05-27 DIAGNOSIS — I10 Essential (primary) hypertension: Secondary | ICD-10-CM

## 2024-05-27 DIAGNOSIS — R600 Localized edema: Secondary | ICD-10-CM

## 2024-05-27 DIAGNOSIS — N184 Chronic kidney disease, stage 4 (severe): Secondary | ICD-10-CM

## 2024-05-27 DIAGNOSIS — I509 Heart failure, unspecified: Secondary | ICD-10-CM

## 2024-05-27 DIAGNOSIS — E875 Hyperkalemia: Secondary | ICD-10-CM

## 2024-05-27 DIAGNOSIS — N189 Chronic kidney disease, unspecified: Secondary | ICD-10-CM

## 2024-05-27 DIAGNOSIS — E8770 Fluid overload, unspecified: Secondary | ICD-10-CM | POA: Diagnosis not present

## 2024-05-27 LAB — PROTIME-INR
INR: 1.2 (ref 0.8–1.2)
Prothrombin Time: 15.4 s — ABNORMAL HIGH (ref 11.4–15.2)

## 2024-05-27 LAB — BASIC METABOLIC PANEL WITH GFR
Anion gap: 8 (ref 5–15)
BUN: 60 mg/dL — ABNORMAL HIGH (ref 8–23)
CO2: 20 mmol/L — ABNORMAL LOW (ref 22–32)
Calcium: 8.2 mg/dL — ABNORMAL LOW (ref 8.9–10.3)
Chloride: 110 mmol/L (ref 98–111)
Creatinine, Ser: 2.91 mg/dL — ABNORMAL HIGH (ref 0.61–1.24)
GFR, Estimated: 23 mL/min — ABNORMAL LOW (ref >60)
Glucose, Bld: 123 mg/dL — ABNORMAL HIGH (ref 70–99)
Potassium: 5.6 mmol/L — ABNORMAL HIGH (ref 3.5–5.1)
Sodium: 138 mmol/L (ref 135–145)

## 2024-05-27 LAB — CBC
HCT: 32.9 % — ABNORMAL LOW (ref 39.0–52.0)
Hemoglobin: 10.7 g/dL — ABNORMAL LOW (ref 13.0–17.0)
MCH: 32.7 pg (ref 26.0–34.0)
MCHC: 32.5 g/dL (ref 30.0–36.0)
MCV: 100.6 fL — ABNORMAL HIGH (ref 80.0–100.0)
Platelets: 284 10*3/uL (ref 150–400)
RBC: 3.27 MIL/uL — ABNORMAL LOW (ref 4.22–5.81)
RDW: 14.1 % (ref 11.5–15.5)
WBC: 8.1 10*3/uL (ref 4.0–10.5)
nRBC: 0 % (ref 0.0–0.2)

## 2024-05-27 LAB — HIV ANTIBODY (ROUTINE TESTING W REFLEX): HIV Screen 4th Generation wRfx: NONREACTIVE

## 2024-05-27 MED ORDER — POLYETHYLENE GLYCOL 3350 17 G PO PACK
17.0000 g | PACK | Freq: Once | ORAL | Status: AC
  Administered 2024-05-27: 17 g via ORAL
  Filled 2024-05-27: qty 1

## 2024-05-27 MED ORDER — SODIUM ZIRCONIUM CYCLOSILICATE 10 G PO PACK
10.0000 g | PACK | Freq: Every day | ORAL | Status: DC
  Administered 2024-05-27 – 2024-05-28 (×2): 10 g via ORAL
  Filled 2024-05-27 (×2): qty 1

## 2024-05-27 NOTE — Consult Note (Signed)
 Cardiology Consultation:   Patient ID: Victor Willis; [id number]; 25-Sep-1956   Admit date: 05/26/2024 Date of Consult: 05/27/2024  Primary Care Provider: Pcp, No Primary Cardiologist: New - consult by End, MD Primary Electrophysiologist:  None   Patient Profile:   Victor Willis is a 68 y.o. male with a hx of coronary artery calcification noted on CT imaging, CKD stage IV, DM2, aortic atherosclerosis, HTN, prior heavy alcohol use with history of alcoholic pancreatitis, ongoing tobacco use, ankylosing spondylitis, macrocytic anemia, diverticulosis, and prior back and hip surgery (in the remote past) who is being seen today for the evaluation of volume overload at the request of Dr. Lydia Sams.  History of Present Illness:   Victor Willis has not previously been evaluated by cardiology.  He reports moving to [place]  from [place]  approximately 3 to 5 years ago.  He has an extensive history of alcohol use, drinking a handle of liquor every 2 to 3 days for at least 30 years and continues to drink 1 mixed drink per night.  He also continues to smoke tobacco since age 45.  He was recently found to have progressive renal dysfunction on labs through his PCP's office on 05/21/2024 with a serum creatinine of 2.3 and a potassium of 6.4.  Patient indicates he was advised to decrease his lisinopril by half and added HCTZ.  Following this, the patient noticed progressive shortness of breath, lower extremity swelling, and generalized malaise and fatigue.  In this setting he presented to the ED on 05/26/2024 for further evaluation.  Upon arrival he was hemodynamically stable with a blood pressure in the 130s systolic, tachycardic with a heart rate of 120 bpm, afebrile, and with oxygen saturation 100% on room air.  EKG shows sinus tachycardia, 119 bpm, LVH with early repolarization abnormalities and nonspecific lateral ST-T changes.  Chest x-ray showed hyperinflation with right greater than left pleural  effusions.  Labs notable for serum creatinine 2.66, BUN 51, potassium 5.3.  High-sensitivity troponin 59 with a delta troponin 57 currently trended to 69.  BNP 3571.  Hgb 10.8 trending to 9.9 with most recent hemoglobin 10.7.  In the ER he received Lokelma and 20 mg of IV Lasix.  BUN/SCR is trended to 60/2.91.  At time of cardiology consult patient is laying in the bed without significant orthopnea.  He reports ongoing malaise and fatigue, bilateral ankle edema, and shortness of breath.  He is without symptoms of chest pain.  No palpitations, dizziness, presyncope, or syncope.  Weight largely unchanged at 59 kg this admission.  Intake/output not complete.  Echo pending.    Past Medical History:  Diagnosis Date   Alcohol abuse    Ankylosing spondylitis (HCC)    Aortic atherosclerosis (HCC)    Chronic kidney disease (CKD), stage IV (severe) (HCC)    Coronary artery calcification    Hypertension    Macrocytic anemia     Past Surgical History:  Procedure Laterality Date   BACK SURGERY     TOTAL HIP ARTHROPLASTY Bilateral      Home Meds: Prior to Admission medications   Medication Sig Start Date End Date Taking? Authorizing Provider  cyclobenzaprine (FLEXERIL) 10 MG tablet Take 10 mg by mouth 2 (two) times daily. 09/19/19  Yes [provider]  lisinopril-hydrochlorothiazide (ZESTORETIC) 20-12.5 MG tablet Take 1 tablet by mouth daily. 05/21/24 05/21/25 Yes [provider]  metoprolol  succinate (TOPROL  XL) 25 MG 24 hr tablet Take 1 tablet (25 mg total) by mouth daily. 10/31/19 05/26/24  Yes Krishnan, Sendil K, MD  Multiple Vitamin (MULTI-VITAMIN) tablet Take 1 tablet by mouth daily.   Yes [provider]  oxycodone  (ROXICODONE ) 30 MG immediate release tablet Take 30 mg by mouth 5 (five) times daily. 10/23/19  Yes [provider]  sulfaSALAzine  (AZULFIDINE ) 500 MG tablet Take 500 mg by mouth 2 (two) times daily. 09/19/19  Yes [provider]  aspirin EC 81  MG tablet Take 81 mg by mouth every other day.  Patient not taking: Reported on 05/26/2024    [provider]  Multiple Vitamins-Minerals (MULTIVITAMIN GUMMIES ADULT PO) Take 2 each by mouth daily.    [provider]    Inpatient Medications: Scheduled Meds:  aspirin EC  81 mg Oral QODAY   furosemide  40 mg Intravenous BID   heparin   5,000 Units Subcutaneous Q8H   metoprolol  succinate  25 mg Oral Daily   nicotine   21 mg Transdermal Daily   oxycodone   30 mg Oral 5 X Daily   polyethylene glycol  17 g Oral Once   sodium zirconium cyclosilicate  10 g Oral Daily   sulfaSALAzine   500 mg Oral BID   Continuous Infusions:  PRN Meds:   Allergies:   Allergies  Allergen Reactions   Celecoxib Rash, Anaphylaxis and Dermatitis   Atorvastatin Other (See Comments)    fatigue   Morphine  Other (See Comments)    PT STATE IT MAKE HIM FEEL WAY TOO LOOPY, CAN TOLERATE DILAUDID  VERY WELL     PT CAN ALSO TOLERATE HYDROCODONE VERY WELL   Prednisone Other (See Comments)    Throat swelling per patient    Social History:   Social History   Socioeconomic History   Marital status: Single    Spouse name: Not on file   Number of children: Not on file   Years of education: Not on file   Highest education level: Not on file  Occupational History   Not on file  Tobacco Use   Smoking status: Every Day   Smokeless tobacco: Never  Substance and Sexual Activity   Alcohol use: Yes    Comment: daily, heavy   Drug use: Not on file   Sexual activity: Not on file  Other Topics Concern   Not on file  Social History Narrative   Not on file   Social Drivers of Health   Financial Resource Strain: Patient Declined (04/09/2023)   [facility] from [facility]   Overall Financial Resource Strain (CARDIA)    Difficulty of Paying Living Expenses: Patient declined  Food Insecurity: No Food Insecurity (05/26/2024)   Hunger Vital Sign    Worried About Running Out of Food in the  Last Year: Never true    Ran Out of Food in the Last Year: Never true  Transportation Needs: No Transportation Needs (05/26/2024)   PRAPARE - Administrator, Civil Service (Medical): No    Lack of Transportation (Non-Medical): No  Physical Activity: Not on file  Stress: Not on file  Social Connections: Socially Isolated (05/26/2024)   Social Connection and Isolation Panel    Frequency of Communication with Friends and Family: Once a week    Frequency of Social Gatherings with Friends and Family: Once a week    Attends Religious Services: 1 to 4 times per year    Active Member of [employer] or Organizations: Patient declined    Attends Banker Meetings: Never    Marital Status: Divorced  Catering manager Violence: Not  At Risk (05/26/2024)   Humiliation, Afraid, Rape, and Kick questionnaire    Fear of Current or Ex-Partner: No    Emotionally Abused: No    Physically Abused: No    Sexually Abused: No     Family History:   Family History  Problem Relation Age of Onset   Diabetes Mellitus II Mother    Diabetes Mellitus II Father    Ankylosing spondylitis Daughter     ROS:  Review of Systems  Constitutional:  Positive for malaise/fatigue. Negative for chills, diaphoresis, fever and weight loss.  HENT:  Negative for congestion.   Eyes:  Negative for discharge and redness.  Respiratory:  Positive for shortness of breath. Negative for cough, sputum production and wheezing.   Cardiovascular:  Positive for orthopnea and leg swelling. Negative for chest pain, palpitations, claudication and PND.  Gastrointestinal:  Negative for abdominal pain, heartburn, nausea and vomiting.  Musculoskeletal:  Positive for joint pain and neck pain. Negative for falls and myalgias.  Skin:  Negative for rash.  Neurological:  Negative for dizziness, tingling, tremors, sensory change, speech change, focal weakness, loss of consciousness and weakness.  Endo/Heme/Allergies:  Does not  bruise/bleed easily.  Psychiatric/Behavioral:  Negative for substance abuse. The patient is not nervous/anxious.   All other systems reviewed and are negative.     Physical Exam/Data:   Vitals:   05/27/24 0600 05/27/24 0601 05/27/24 0830 05/27/24 1128  BP: (!) 148/99  (!) 147/88 (!) 124/92  Pulse: (!) 117  (!) 118 95  Resp:   20 20  Temp:   97.6 F (36.4 C) 98.1 F (36.7 C)  TempSrc:      SpO2:   100% 99%  Weight:  59.8 kg    Height:        Intake/Output Summary (Last 24 hours) at 05/27/2024 1238 Last data filed at 05/27/2024 1057 Gross per 24 hour  Intake 360 ml  Output --  Net 360 ml   Filed Weights   05/26/24 1409 05/26/24 2023 05/27/24 0601  Weight: 59 kg 59.8 kg 59.8 kg   Body mass index is 25.74 kg/m.   Physical Exam: General: Well developed, well nourished, in no acute distress. Head: Normocephalic, atraumatic, sclera non-icteric, no xanthomas, nares without discharge.  Neck: Negative for carotid bruits. JVD difficult to assess secondary limited ROM of cervical spine. Lungs: Mildly diminished breath sounds along the bilateral bases. Breathing is unlabored. Heart: RRR with S1 S2. No murmurs, rubs, or gallops appreciated. Abdomen: Soft, non-tender, non-distended with normoactive bowel sounds. No hepatomegaly. No rebound/guarding. No obvious abdominal masses. Msk:  Strength and tone appear normal for age. Extremities: No clubbing or cyanosis. Mild bilateral ankle edema.  Neuro: Alert and oriented X 3. No facial asymmetry. No focal deficit. Moves all extremities spontaneously. Psych:  Responds to questions appropriately with a normal affect.   EKG:  The EKG was personally reviewed and demonstrates: Sinus tachycardia, 119 bpm, LVH with early repolarization abnormality, nonspecific lateral ST-T changes Telemetry:  Telemetry was personally reviewed and demonstrates: possible atrial tachycardia with rates in the 1-teens to 130s with brief episodes of sinus rhythm into  the 80s bpm  Weights: Filed Weights   05/26/24 1409 05/26/24 2023 05/27/24 0601  Weight: 59 kg 59.8 kg 59.8 kg    Relevant CV Studies: 2D echo pending  Laboratory Data:  Chemistry Recent Labs  Lab 05/26/24 1414 05/26/24 2059 05/27/24 0532  NA 139  --  138  K 5.3*  --  5.6*  CL 110  --  110  CO2 18*  --  20*  GLUCOSE 171*  --  123*  BUN 51*  --  60*  CREATININE 2.66* 2.78* 2.91*  CALCIUM 8.1*  --  8.2*  GFRNONAA 26* 24* 23*  ANIONGAP 11  --  8    No results for input(s): PROT, ALBUMIN, AST, ALT, ALKPHOS, BILITOT in the last 168 hours. Hematology Recent Labs  Lab 05/26/24 1414 05/26/24 2059 05/27/24 0532  WBC 8.0 10.0 8.1  RBC 3.38* 3.10* 3.27*  HGB 10.8* 9.9* 10.7*  HCT 34.1* 31.3* 32.9*  MCV 100.9* 101.0* 100.6*  MCH 32.0 31.9 32.7  MCHC 31.7 31.6 32.5  RDW 14.0 14.0 14.1  PLT 291 273 284   Cardiac EnzymesNo results for input(s): TROPONINI in the last 168 hours. No results for input(s): TROPIPOC in the last 168 hours.  BNP Recent Labs  Lab 05/26/24 1414  BNP 3,571.6*    DDimer No results for input(s): DDIMER in the last 168 hours.  Radiology/Studies:  DG Chest 2 View Result Date: 05/26/2024 IMPRESSION: Hyperinflation. Pleural effusions, right-greater-than-left. Enlarged heart. Electronically Signed   By: Adrianna Horde M.D.   On: 05/26/2024 14:51    Assessment and Plan:   1.  Volume overload: - Of uncertain etiology at this time - Cannot exclude underlying CHF versus progressive renal decline versus possible cardiorenal syndrome - Continue IV Lasix 40 mg twice daily, if there is continual decline in renal function may need to consider transitioning to Lasix drip - Await echo to help guide therapy with further recommendations pending - Daily weights with strict I's and O's  2.  Coronary artery calcification with elevated high-sensitivity troponin: - Currently without symptoms of angina - Mildly elevated and flat trending  high-sensitivity troponin likely in the setting of volume overload and progressive renal decline with underlying macrocytic anemia - Not consistent with ACS - Multiple risk factors for ischemic heart disease including ongoing tobacco use for numerous years, HTN, and HLD - Await echo - Not currently a candidate for cardiac catheter coronary CTA given advanced renal dysfunction - ASA 81 mg - Previously reported fatigue with atorvastatin, will look to rechallenge with rosuvastatin down the road  3.  Possible atrial tachycardia: - Continue Toprol -XL - Monitor on telemetry  4.  Hyperkalemia: - Status post Lokelma - Avoid ACE inhibitor, ARB, ARNI, MRA - Ongoing management per IM and nephrology  5.  Acute on CKD stage IV: - Nephrology has been consulted  6.  Macrocytic anemia: - Hemoglobin stable  7.  Alcohol and tobacco use: - Has significantly decreased alcohol use with complete cessation of alcohol and tobacco recommended      For questions or updates, please contact [facility] Please consult [email or web address] for contact info under Cardiology/STEMI.   Signed, Varney Gentleman, PA-C [facility] Pager: [phone] 05/27/2024, 12:38 PM

## 2024-05-27 NOTE — Progress Notes (Signed)
 [facility]  - [place] at [facility]   PATIENT NAME: Victor Willis    MR#:  [id number]  DATE OF BIRTH:  03-01-1956  SUBJECTIVE:  patient's wife and daughter at bedside. Apparently came in with normal labs including potassium elevated as outpatient when he went to see orthopedic surgery for arthritis. Patient has known history of CKD stage IV. Has not followed up or nephrology for past year. Has mild shortness of breath although breathing comfortably. Denies any chest pain. Family had a lot of questions tried to answer to my ability.   VITALS:  Blood pressure (!) 124/92, pulse 95, temperature 98.1 F (36.7 C), resp. rate 20, height 5' (1.524 m), weight 59.8 kg, SpO2 99%.  PHYSICAL EXAMINATION:   GENERAL:  68 y.o.-year-old patient with no acute distress.  LUNGS: decreased breath sounds bilaterally, no wheezing CARDIOVASCULAR: S1, S2 normal. No murmur   ABDOMEN: Soft, nontender, nondistended. Bowel sounds present.  EXTREMITIES: No  edema b/l.    NEUROLOGIC: nonfocal  patient is alert and awake   LABORATORY PANEL:  CBC Recent Labs  Lab 05/27/24 0532  WBC 8.1  HGB 10.7*  HCT 32.9*  PLT 284    Chemistries  Recent Labs  Lab 05/27/24 0532  NA 138  K 5.6*  CL 110  CO2 20*  GLUCOSE 123*  BUN 60*  CREATININE 2.91*  CALCIUM 8.2*   Cardiac Enzymes No results for input(s): TROPONINI in the last 168 hours. RADIOLOGY:  DG Chest 2 View Result Date: 05/26/2024 CLINICAL DATA:  Shortness of breath EXAM: CHEST - 2 VIEW COMPARISON:  None Available. FINDINGS: Underinflation. Small bilateral pleural effusions are seen, right-greater-than-left adjacent opacities. Enlarged cardiopericardial silhouette. Tortuous ectatic aorta. Interstitial prominence noted. No pneumothorax. Frontal view is rotated to the right. Osteopenia. Diffuse bridging syndesmophytes. Air-fluid level along the stomach beneath the left hemidiaphragm. IMPRESSION: Hyperinflation. Pleural effusions,  right-greater-than-left. Enlarged heart. Electronically Signed   By: Adrianna Horde M.D.   On: 05/26/2024 14:51   Assessment and Plan   Victor Willis is a 68 y.o. male with a hx of coronary artery calcification noted on CT imaging, CKD stage IV, DM2, aortic atherosclerosis, HTN, prior heavy alcohol use with history of alcoholic pancreatitis, ongoing tobacco use, ankylosing spondylitis, macrocytic anemia, diverticulosis, and prior back and hip surgery came in after he was asked by orthopedic surgery to come to the emergency room for potassium 6.4.  Hyperkalemia acute on chronic kidney disease stage IV/type II diabetes/hypertension -- patient's potassium is down to 5.6 -- nephrology consultation with Dr. Christain Courser on [facility] -- receiving IV Lasix monitor input output and metabolic panel --?Cardiorenal syndrome  Volume overload/fluid overload/congestive heart failure-- EF unknown -- IV Lasix BID monitor input output creatinine -- cardiology consultation with [facility] cardiology -- Echo -- daily weights and Strick input output  History of atrial tachycardia -- continue metoprolol   History of smoking and alcohol use -- recommend cessation  Elevated troponin appears demand ischemia in the setting of CKD stage IV -- no chest pain -- continue aspirin    Procedures: Family communication : wife, daughter Consults : [facility] cardiology, nephrology CODE STATUS: full DVT Prophylaxis : heparin  Level of care: Telemetry Cardiac Status is: Inpatient Remains inpatient appropriate because: CHF, acute on chronic CKD    TOTAL TIME TAKING CARE OF THIS PATIENT: 45 minutes.  >50% time spent on counselling and coordination of care  Note: This dictation was prepared with Dragon dictation along with smaller phrase technology. Any transcriptional errors that result  from this process are unintentional.  Melvinia Stager M.D    [facility]   CC: Primary care physician; Pcp, No

## 2024-05-27 NOTE — Plan of Care (Signed)

## 2024-05-27 NOTE — Progress Notes (Signed)
 Victor Willis, Victor Willis 05/27/24  Subjective:   Hospital day # 1  Patient known to our practice from outpatient follow-up for CKD.  He was last seen in March 2024 by Dr. Rhesa Celeste.  He has diabetic nephropathy, hypertension and chronic hyperkalemia.  Patient was lost to follow-up.  He presented to the emergency room on Monday for abnormal labs specifically high potassium.  Upon first presentation his potassium was found to be 5.3.  Outpatient potassium on June 11 was 6.4. His baseline creatinine appears to be between 2.0-2.3 with GFR ranging from 30-36.  Admission creatinine is 2.66 which has worsened to 2.91 today with corresponding GFR of 29.  Potassium remains elevated at 5.6. Patient also has mild anemia with hemoglobin of 10.7. Multiple family members are at bedside today when seen.  Nephrology consult has been requested for evaluation of CKD as well as hyperkalemia.     Objective:  Vital signs in last 24 hours:  Temp:  [97.5 F (36.4 C)-98.1 F (36.7 C)] 98.1 F (36.7 C) (06/17 1128) Pulse Rate:  [95-120] 95 (06/17 1128) Resp:  [18-22] 20 (06/17 1128) BP: (111-155)/(71-104) 124/92 (06/17 1128) SpO2:  [95 %-100 %] 99 % (06/17 1128) Weight:  [59 kg-59.8 kg] 59.8 kg (06/17 0601)  Weight change:  Filed Weights   05/26/24 1409 05/26/24 2023 05/27/24 0601  Weight: 59 kg 59.8 kg 59.8 kg    Intake/Output:    Intake/Output Summary (Last 24 hours) at 05/27/2024 1335 Last data filed at 05/27/2024 1057 Gross per 24 hour  Intake 360 ml  Output --  Net 360 ml     Physical Exam: General: Sitting up in the bed, no acute distress  HEENT Moist oral mucous membranes, anicteric  Pulm/lungs Normal breathing effort on room air, clear to auscultation  CVS/Heart Soft systolic murmur, tachycardic  Abdomen:  Soft, nontender  Extremities: 1+ peripheral edema bilaterally  Neurologic: Alert and oriented  Skin: No acute rashes          Basic Metabolic  Panel:  Recent Labs  Lab 05/26/24 1414 05/26/24 2059 05/27/24 0532  NA 139  --  138  K 5.3*  --  5.6*  CL 110  --  110  CO2 18*  --  20*  GLUCOSE 171*  --  123*  BUN 51*  --  60*  CREATININE 2.66* 2.78* 2.91*  CALCIUM 8.1*  --  8.2*     CBC: Recent Labs  Lab 05/26/24 1414 05/26/24 2059 05/27/24 0532  WBC 8.0 10.0 8.1  HGB 10.8* 9.9* 10.7*  HCT 34.1* 31.3* 32.9*  MCV 100.9* 101.0* 100.6*  PLT 291 273 284     No results found for: HEPBSAG, HEPBSAB, HEPBIGM    Microbiology:  No results found for this or any previous visit (from the past 240 hours).  Coagulation Studies: Recent Labs    05/27/24 0532  LABPROT 15.4*  INR 1.2    Urinalysis: No results for input(s): COLORURINE, LABSPEC, PHURINE, GLUCOSEU, HGBUR, BILIRUBINUR, KETONESUR, PROTEINUR, UROBILINOGEN, NITRITE, LEUKOCYTESUR in the last 72 hours.  Invalid input(s): APPERANCEUR    Imaging: DG Chest 2 View Result Date: 05/26/2024 CLINICAL DATA:  Shortness of breath EXAM: CHEST - 2 VIEW COMPARISON:  None Available. FINDINGS: Underinflation. Small bilateral pleural effusions are seen, right-greater-than-left adjacent opacities. Enlarged cardiopericardial silhouette. Tortuous ectatic aorta. Interstitial prominence noted. No pneumothorax. Frontal view is rotated to the right. Osteopenia. Diffuse bridging syndesmophytes. Air-fluid level along the stomach beneath the left hemidiaphragm. IMPRESSION: Hyperinflation. Pleural effusions, right-greater-than-left.  Enlarged heart. Electronically Signed   By: Adrianna Horde M.D.   On: 05/26/2024 14:51     Medications:     aspirin EC  81 mg Oral QODAY   furosemide  40 mg Intravenous BID   heparin   5,000 Units Subcutaneous Q8H   metoprolol  succinate  25 mg Oral Daily   nicotine   21 mg Transdermal Daily   oxycodone   30 mg Oral 5 X Daily   polyethylene glycol  17 g Oral Once   sodium zirconium cyclosilicate  10 g Oral Daily   sulfaSALAzine    500 mg Oral BID     Assessment/ Plan:  68 y.o. 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 He also has chronic hyperkalemia.  He was taking lisinopril/HCTZ at home which can contribute to high potassium.  He denies drinking excessive orange juice but does eat potatoes/chips etc.  He stopped taking Lokelma and Farxiga due to cost issues.  Plan: We discussed low potassium diet including avoiding potato products and fruit juices. Restart Lokelma 10 g p.o. daily SGLT2 inhibitor can be started as outpatient especially with patient assistance program if possible Agree with holding lisinopril for now but can be restarted at low-dose once potassium is in the normal range. Serum creatinine likely worsening due to cardiorenal abnormal hemodynamics. Currently getting furosemide 40 mg IV twice a day.  Would monitor volume status carefully and avoid hypotension. Iron panel, B12, folate level ordered for tomorrow morning. We will follow along during hospitalization.   LOS: 1 Law Corsino 6/17/20251:35 PM  [facility] [address] [place], Victor Willis [phone]  Note: This note was prepared with Dragon dictation. Any transcription errors are  unintentional

## 2024-05-27 NOTE — Progress Notes (Signed)
 9:30 attempted to visit with pt to educate on paperwork for living will and HCPOA-staff in room  Visited again this evening and pt was resting at this time will try back before it gets to late  this evening

## 2024-05-28 ENCOUNTER — Inpatient Hospital Stay (HOSPITAL_COMMUNITY)
Admit: 2024-05-28 | Discharge: 2024-05-28 | Disposition: A | Discharge status: Home / Self Care | Attending: Internal Medicine | Admitting: Internal Medicine

## 2024-05-28 DIAGNOSIS — I420 Dilated cardiomyopathy: Secondary | ICD-10-CM

## 2024-05-28 DIAGNOSIS — I5021 Acute systolic (congestive) heart failure: Secondary | ICD-10-CM

## 2024-05-28 DIAGNOSIS — I10 Essential (primary) hypertension: Secondary | ICD-10-CM | POA: Diagnosis not present

## 2024-05-28 DIAGNOSIS — I5023 Acute on chronic systolic (congestive) heart failure: Secondary | ICD-10-CM | POA: Insufficient documentation

## 2024-05-28 DIAGNOSIS — E8770 Fluid overload, unspecified: Secondary | ICD-10-CM | POA: Diagnosis not present

## 2024-05-28 DIAGNOSIS — Z720 Tobacco use: Secondary | ICD-10-CM

## 2024-05-28 DIAGNOSIS — F1010 Alcohol abuse, uncomplicated: Secondary | ICD-10-CM

## 2024-05-28 DIAGNOSIS — I5031 Acute diastolic (congestive) heart failure: Secondary | ICD-10-CM

## 2024-05-28 DIAGNOSIS — R0602 Shortness of breath: Principal | ICD-10-CM

## 2024-05-28 DIAGNOSIS — J90 Pleural effusion, not elsewhere classified: Secondary | ICD-10-CM

## 2024-05-28 LAB — COMPREHENSIVE METABOLIC PANEL WITH GFR
ALT: 11 U/L (ref 0–44)
AST: 22 U/L (ref 15–41)
Albumin: 2.9 g/dL — ABNORMAL LOW (ref 3.5–5.0)
Alkaline Phosphatase: 63 U/L (ref 38–126)
Anion gap: 9 (ref 5–15)
BUN: 58 mg/dL — ABNORMAL HIGH (ref 8–23)
CO2: 20 mmol/L — ABNORMAL LOW (ref 22–32)
Calcium: 8.5 mg/dL — ABNORMAL LOW (ref 8.9–10.3)
Chloride: 109 mmol/L (ref 98–111)
Creatinine, Ser: 2.71 mg/dL — ABNORMAL HIGH (ref 0.61–1.24)
GFR, Estimated: 25 mL/min — ABNORMAL LOW (ref >60)
Glucose, Bld: 129 mg/dL — ABNORMAL HIGH (ref 70–99)
Potassium: 6 mmol/L — ABNORMAL HIGH (ref 3.5–5.1)
Sodium: 138 mmol/L (ref 135–145)
Total Bilirubin: 0.5 mg/dL (ref 0.0–1.2)
Total Protein: 6.4 g/dL — ABNORMAL LOW (ref 6.5–8.1)

## 2024-05-28 LAB — ECHOCARDIOGRAM COMPLETE
AR max vel: 1.63 cm2
AV Area VTI: 1.59 cm2
AV Area mean vel: 1.52 cm2
AV Mean grad: 2 mmHg
AV Peak grad: 4.5 mmHg
Ao pk vel: 1.06 m/s
Area-P 1/2: 6.37 cm2
Est EF: <20
Height: 60 in
MV M vel: 4.58 m/s
MV Peak grad: 83.9 mmHg
Radius: 0.7 cm
S' Lateral: 5.4 cm
Single Plane A4C EF: 8.7 %
Weight: 2043.2 [oz_av]

## 2024-05-28 LAB — PROTEIN / CREATININE RATIO, URINE
Creatinine, Urine: 40 mg/dL
Protein Creatinine Ratio: 7.4 mg/mg{creat} — ABNORMAL HIGH (ref 0.00–0.15)
Total Protein, Urine: 296 mg/dL

## 2024-05-28 LAB — IRON AND TIBC
Iron: 50 ug/dL (ref 45–182)
Saturation Ratios: 19 % (ref 17.9–39.5)
TIBC: 266 ug/dL (ref 250–450)
UIBC: 216 ug/dL

## 2024-05-28 LAB — POTASSIUM: Potassium: 5 mmol/L (ref 3.5–5.1)

## 2024-05-28 LAB — LACTIC ACID, PLASMA: Lactic Acid, Venous: 2 mmol/L (ref 0.5–1.9)

## 2024-05-28 LAB — FOLATE: Folate: 17.6 ng/mL (ref >5.9)

## 2024-05-28 LAB — FERRITIN: Ferritin: 101 ng/mL (ref 24–336)

## 2024-05-28 LAB — VITAMIN B12: Vitamin B-12: 729 pg/mL (ref 180–914)

## 2024-05-28 MED ORDER — POLYETHYLENE GLYCOL 3350 17 G PO PACK
17.0000 g | PACK | Freq: Every day | ORAL | Status: DC
  Administered 2024-05-28 – 2024-05-31 (×2): 17 g via ORAL
  Filled 2024-05-28 (×4): qty 1

## 2024-05-28 MED ORDER — PATIROMER SORBITEX CALCIUM 8.4 G PO PACK
25.2000 g | PACK | Freq: Every day | ORAL | Status: DC
  Administered 2024-05-28: 25.2 g via ORAL
  Filled 2024-05-28 (×2): qty 3

## 2024-05-28 MED ORDER — FUROSEMIDE 10 MG/ML IJ SOLN
80.0000 mg | Freq: Two times a day (BID) | INTRAMUSCULAR | Status: DC
  Administered 2024-05-28 – 2024-05-30 (×4): 80 mg via INTRAVENOUS
  Filled 2024-05-28 (×4): qty 8

## 2024-05-28 MED ORDER — FUROSEMIDE 10 MG/ML IJ SOLN: 20.0000 mg | Freq: Every day | INTRAMUSCULAR | Status: DC

## 2024-05-28 MED ORDER — ALBUTEROL SULFATE (2.5 MG/3ML) 0.083% IN NEBU: 2.5000 mg | INHALATION_SOLUTION | Freq: Four times a day (QID) | RESPIRATORY_TRACT | Status: DC | PRN

## 2024-05-28 NOTE — Progress Notes (Signed)
*  PRELIMINARY RESULTS* Echocardiogram 2D Echocardiogram has been performed.  Victor Willis 05/28/2024, 12:40 PM

## 2024-05-28 NOTE — Consult Note (Addendum)
 Advanced Heart Failure Team Consult Note   Primary Physician: Pcp, No Cardiologist:  None  Reason for Consultation: Acute systolic HF  HPI:    Victor Willis is seen today for evaluation of acute systolic HF at the request of Dr. Gollan.   68 y/o male with ankylosing spondylitis, CKD, extensive tobacco and ETOH use. No previous known heart disease. Admitted with 3 week h/o HF symptoms with DOE, fatigue and LE edema. Denies CP  Has smoked 1/2-1ppd since age 20. Very heavy ETOH use. Brother says he has cut back to 1 drink per day but it is the size of 5 regular drinks.  Echo LVEF < 20% RV mild to moderately reduced.   SCr now 2.5-2.9   Hstrop negative  Home Medications Prior to Admission medications   Medication Sig Start Date End Date Taking? Authorizing Provider  cyclobenzaprine (FLEXERIL) 10 MG tablet Take 10 mg by mouth 2 (two) times daily. 09/19/19  Yes [provider]  lisinopril-hydrochlorothiazide (ZESTORETIC) 20-12.5 MG tablet Take 1 tablet by mouth daily. 05/21/24 05/21/25 Yes [provider]  metoprolol  succinate (TOPROL  XL) 25 MG 24 hr tablet Take 1 tablet (25 mg total) by mouth daily. 10/31/19 05/26/24 Yes Krishnan, Sendil K, MD  Multiple Vitamin (MULTI-VITAMIN) tablet Take 1 tablet by mouth daily.   Yes [provider]  oxycodone  (ROXICODONE ) 30 MG immediate release tablet Take 30 mg by mouth 5 (five) times daily. 10/23/19  Yes [provider]  sulfaSALAzine  (AZULFIDINE ) 500 MG tablet Take 500 mg by mouth 2 (two) times daily. 09/19/19  Yes [provider]  aspirin EC 81 MG tablet Take 81 mg by mouth every other day.  Patient not taking: Reported on 05/26/2024    [provider]  Multiple Vitamins-Minerals (MULTIVITAMIN GUMMIES ADULT PO) Take 2 each by mouth daily.    [provider]    Past Medical History: Past Medical History:  Diagnosis Date   Alcohol abuse    Ankylosing spondylitis (HCC)     Aortic atherosclerosis (HCC)    Chronic kidney disease (CKD), stage IV (severe) (HCC)    Coronary artery calcification    Hypertension    Macrocytic anemia     Past Surgical History: Past Surgical History:  Procedure Laterality Date   BACK SURGERY     TOTAL HIP ARTHROPLASTY Bilateral     Family History: Family History  Problem Relation Age of Onset   Diabetes Mellitus II Mother    Diabetes Mellitus II Father    Ankylosing spondylitis Daughter     Social History: Social History   Socioeconomic History   Marital status: Single    Spouse name: Not on file   Number of children: Not on file   Years of education: Not on file   Highest education level: Not on file  Occupational History   Not on file  Tobacco Use   Smoking status: Every Day   Smokeless tobacco: Never  Substance and Sexual Activity   Alcohol use: Yes    Comment: daily, heavy   Drug use: Not on file   Sexual activity: Not on file  Other Topics Concern   Not on file  Social History Narrative   Not on file   Social Drivers of Health   Financial Resource Strain: Patient Declined (04/09/2023)   Received from [facility]   Overall Financial Resource Strain (CARDIA)    Difficulty of Paying Living Expenses: Patient declined  Food Insecurity: No Food Insecurity (05/26/2024)  Hunger Vital Sign    Worried About Running Out of Food in the Last Year: Never true    Ran Out of Food in the Last Year: Never true  Transportation Needs: No Transportation Needs (05/26/2024)   PRAPARE - Administrator, Civil Service (Medical): No    Lack of Transportation (Non-Medical): No  Physical Activity: Not on file  Stress: Not on file  Social Connections: Socially Isolated (05/26/2024)   Social Connection and Isolation Panel    Frequency of Communication with Friends and Family: Once a week    Frequency of Social Gatherings with Friends and Family: Once a week    Attends Religious Services: 1 to 4  times per year    Active Member of [employer] or Organizations: Patient declined    Attends Banker Meetings: Never    Marital Status: Divorced    Allergies:  Allergies  Allergen Reactions   Celecoxib Rash, Anaphylaxis and Dermatitis   Atorvastatin Other (See Comments)    fatigue   Morphine  Other (See Comments)    PT STATE IT MAKE HIM FEEL WAY TOO LOOPY, CAN TOLERATE DILAUDID  VERY WELL     PT CAN ALSO TOLERATE HYDROCODONE VERY WELL   Prednisone Other (See Comments)    Throat swelling per patient    Objective:    Vital Signs:   Temp:  [97 F (36.1 C)-97.7 F (36.5 C)] 97.5 F (36.4 C) (06/18 1949) Pulse Rate:  [85-117] 92 (06/18 1949) Resp:  [18-20] 20 (06/18 1949) BP: (108-145)/(82-94) 145/92 (06/18 1949) SpO2:  [97 %-100 %] 98 % (06/18 1949) Weight:  [57.9 kg] 57.9 kg (06/18 0610)    Weight change: Filed Weights   05/26/24 2023 05/27/24 0601 05/28/24 0610  Weight: 59.8 kg 59.8 kg 57.9 kg    Intake/Output:   Intake/Output Summary (Last 24 hours) at 05/28/2024 2258 Last data filed at 05/28/2024 1935 Gross per 24 hour  Intake 240 ml  Output 1000 ml  Net -760 ml      Physical Exam    General:  Chronically ill appearing. No resp difficulty HEENT: normal Neck: supple. JVP to ear prominent v waves . Carotids 2+ bilat; no bruits. No lymphadenopathy or thyromegaly appreciated. Cor: Regular rate & rhythm. No rubs, gallops or murmurs. Lungs:markedly decreased throughout Abdomen: soft, nontender, + distended. No hepatosplenomegaly. No bruits or masses. Good bowel sounds. Extremities: no cyanosis, clubbing, rash, 2+ edema + compression hose. War,  Neuro: alert & orientedx3, cranial nerves grossly intact. moves all 4 extremities w/o difficulty. Affect pleasant   Telemetry   Sinus 80-90s Personally reviewed   EKG    Sinus tach 119 LVH with repol qrs Personally reviewed   Labs   Basic Metabolic Panel: Recent Labs  Lab 05/26/24 1414  05/26/24 2059 05/27/24 0532 05/28/24 0601 05/28/24 1438  NA 139  --  138 138  --   K 5.3*  --  5.6* 6.0* 5.0  CL 110  --  110 109  --   CO2 18*  --  20* 20*  --   GLUCOSE 171*  --  123* 129*  --   BUN 51*  --  60* 58*  --   CREATININE 2.66* 2.78* 2.91* 2.71*  --   CALCIUM 8.1*  --  8.2* 8.5*  --     Liver Function Tests: Recent Labs  Lab 05/28/24 0601  AST 22  ALT 11  ALKPHOS 63  BILITOT 0.5  PROT 6.4*  ALBUMIN 2.9*  No results for input(s): LIPASE, AMYLASE in the last 168 hours. No results for input(s): AMMONIA in the last 168 hours.  CBC: Recent Labs  Lab 05/26/24 1414 05/26/24 2059 05/27/24 0532  WBC 8.0 10.0 8.1  HGB 10.8* 9.9* 10.7*  HCT 34.1* 31.3* 32.9*  MCV 100.9* 101.0* 100.6*  PLT 291 273 284    Cardiac Enzymes: No results for input(s): CKTOTAL, CKMB, CKMBINDEX, TROPONINI in the last 168 hours.  BNP: BNP (last 3 results) Recent Labs    05/26/24 1414  BNP 3,571.6*    ProBNP (last 3 results) No results for input(s): PROBNP in the last 8760 hours.   CBG: No results for input(s): GLUCAP in the last 168 hours.  Coagulation Studies: Recent Labs    05/27/24 0532  LABPROT 15.4*  INR 1.2     Imaging   ECHOCARDIOGRAM COMPLETE Result Date: 05/28/2024    ECHOCARDIOGRAM REPORT   Patient Name:   Akin MARIAN GRANDT Date of Exam: 05/28/2024 Medical Rec #:  [id number]           Height:       60.0 in Accession #:    [id number]          Weight:       127.7 lb Date of Birth:  November 16, 1956           BSA:          1.543 m Patient Age:    68 years            BP:           128/89 mmHg Patient Gender: M                   HR:           99 bpm. Exam Location:  [facility] Procedure: 2D Echo, Cardiac Doppler, Color Doppler, Strain Analysis and 3D Echo            (Both Spectral and Color Flow Doppler were utilized during            procedure). Indications:     CHF-acute diastolic I50.31  History:         Patient has no prior history of Echocardiogram  examinations.                  Risk Factors:Hypertension. CKD.  Sonographer:     Broadus Canes Referring Phys:  [id number] SONA PATEL Diagnosing Phys: Belva Boyden MD  Sonographer Comments: Global longitudinal strain was attempted. IMPRESSIONS  1. Left ventricular ejection fraction, by estimation, is <20%. Left ventricular ejection fraction by 3D volume is 11 %. Left ventricular ejection fraction by PLAX is 12 %. The left ventricle has severely decreased function. The left ventricle demonstrates global hypokinesis. The left ventricular internal cavity size was moderately dilated. Left ventricular diastolic parameters are consistent with Grade II diastolic dysfunction (pseudonormalization).  2. Right ventricular systolic function is normal. The right ventricular size is normal. There is moderately elevated pulmonary artery systolic pressure. The estimated right ventricular systolic pressure is 47.8 mmHg.  3. Left atrial size was moderately dilated.  4. The mitral valve is normal in structure. Moderate mitral valve regurgitation. No evidence of mitral stenosis.  5. Tricuspid valve regurgitation is mild to moderate.  6. The aortic valve is tricuspid. Aortic valve regurgitation is not visualized. No aortic stenosis is present.  7. The inferior vena cava is normal in size with greater than 50% respiratory variability, suggesting right atrial pressure of 3  mmHg. FINDINGS  Left Ventricle: Left ventricular ejection fraction, by estimation, is <20%. Left ventricular ejection fraction by PLAX is 12 %. Left ventricular ejection fraction by 3D volume is 11 %. The left ventricle has severely decreased function. The left ventricle demonstrates global hypokinesis. The left ventricular internal cavity size was moderately dilated. There is no left ventricular hypertrophy. Left ventricular diastolic parameters are consistent with Grade II diastolic dysfunction (pseudonormalization). Right Ventricle: The right ventricular size is normal. No  increase in right ventricular wall thickness. Right ventricular systolic function is normal. There is moderately elevated pulmonary artery systolic pressure. The tricuspid regurgitant velocity is 3.27 m/s, and with an assumed right atrial pressure of 5 mmHg, the estimated right ventricular systolic pressure is 47.8 mmHg. Left Atrium: Left atrial size was moderately dilated. Right Atrium: Right atrial size was normal in size. Pericardium: There is no evidence of pericardial effusion. Mitral Valve: The mitral valve is normal in structure. Moderate mitral valve regurgitation. No evidence of mitral valve stenosis. Tricuspid Valve: The tricuspid valve is normal in structure. Tricuspid valve regurgitation is mild to moderate. No evidence of tricuspid stenosis. Aortic Valve: The aortic valve is tricuspid. Aortic valve regurgitation is not visualized. No aortic stenosis is present. Aortic valve mean gradient measures 2.0 mmHg. Aortic valve peak gradient measures 4.5 mmHg. Aortic valve area, by VTI measures 1.59 cm. Pulmonic Valve: The pulmonic valve was normal in structure. Pulmonic valve regurgitation is not visualized. No evidence of pulmonic stenosis. Aorta: The aortic root is normal in size and structure. Venous: The inferior vena cava is normal in size with greater than 50% respiratory variability, suggesting right atrial pressure of 3 mmHg. IAS/Shunts: No atrial level shunt detected by color flow Doppler. Additional Comments: 3D was performed not requiring image post processing on an independent workstation and was abnormal.  LEFT VENTRICLE PLAX 2D LV EF:         Left            Diastology                ventricular     LV e' medial:    3.15 cm/s                ejection        LV E/e' medial:  30.6                fraction by     LV e' lateral:   7.29 cm/s                PLAX is 12      LV E/e' lateral: 13.2                %. LVIDd:         5.70 cm         2D Longitudinal LVIDs:         5.40 cm         Strain LV PW:          1.00 cm         2D Strain GLS   -3.5 % LV IVS:        1.70 cm         Avg: LVOT diam:     2.30 cm LV SV:         24              3D Volume EF LV SV Index:   16  LV 3D EF:    Left LVOT Area:     4.15 cm                     ventricul                                             ar                                             ejection LV Volumes (MOD)                            fraction LV vol d, MOD    252.0 ml                   by 3D A4C:                                        volume is LV vol s, MOD    230.0 ml                   11 %. A4C: LV SV MOD A4C:   252.0 ml                                3D Volume EF:                                3D EF:        11 % RIGHT VENTRICLE RV Basal diam:  3.40 cm RV Mid diam:    2.50 cm RV S prime:     11.40 cm/s TAPSE (M-mode): 1.5 cm LEFT ATRIUM              Index        RIGHT ATRIUM           Index LA diam:        5.10 cm  3.31 cm/m   RA Area:     19.10 cm LA Vol (A2C):   101.0 ml 65.48 ml/m  RA Volume:   50.70 ml  32.87 ml/m LA Vol (A4C):   74.0 ml  47.97 ml/m LA Biplane Vol: 92.1 ml  59.71 ml/m  AORTIC VALVE AV Area (Vmax):    1.63 cm AV Area (Vmean):   1.52 cm AV Area (VTI):     1.59 cm AV Vmax:           105.50 cm/s AV Vmean:          70.700 cm/s AV VTI:            0.152 m AV Peak Grad:      4.5 mmHg AV Mean Grad:      2.0 mmHg LVOT Vmax:         41.50 cm/s LVOT Vmean:        25.800 cm/s LVOT VTI:          0.058 m LVOT/AV VTI ratio: 0.38  AORTA Ao  Root diam: 2.70 cm MITRAL VALVE                  TRICUSPID VALVE MV Area (PHT): 6.37 cm       TR Peak grad:   42.8 mmHg MV Decel Time: 119 msec       TR Vmax:        327.00 cm/s MR Peak grad:    83.9 mmHg MR Mean grad:    53.0 mmHg    SHUNTS MR Vmax:         458.00 cm/s  Systemic VTI:  0.06 m MR Vmean:        340.0 cm/s   Systemic Diam: 2.30 cm MR PISA:         3.08 cm MR PISA Eff ROA: 24 mm MR PISA Radius:  0.70 cm MV E velocity: 96.40 cm/s MV A velocity: 58.70 cm/s MV E/A ratio:  1.64 Belva Boyden  MD Electronically signed by Belva Boyden MD Signature Date/Time: 05/28/2024/1:08:47 PM    Final      Medications:     Current Medications:  aspirin EC  81 mg Oral QODAY   furosemide  80 mg Intravenous BID   heparin   5,000 Units Subcutaneous Q8H   nicotine   21 mg Transdermal Daily   oxycodone   30 mg Oral 5 X Daily   patiromer  25.2 g Oral Daily   polyethylene glycol  17 g Oral Daily   sulfaSALAzine   500 mg Oral BID    Infusions:    Assessment/Plan   1. Acute systolic HF - Echo 6/25 EF < 20 RV mild to moderately reduced - Suspect ETOH CM but also at high risk for underlying CAD - NYHA IV  - Volume status markedly elevated - situation c/b CKD IV - Overall very tenuous - Not candidate for coronary angiography with CKD IV. Refuses MRI - Not candidate for advanced therapies due to severe lung and kidney disease - Will increase lasix to 80 IV bid and hold b-blocker. If no response will move to ICU for inotropic support to facilitate diuresis - I worry he is getting close to end-stage  2. AKI on CKD IV -baseline 2.0-22 - now 2.9 suspect cardiorenal - daily BMET management as above - Renal team following  3. COPD with ongoing tobacco use - severe on exam - discussed need for cessation  4. ETOH abuse - watch for WD - discussed need for cessation  5. Hyperkalemia - Renal managing with [place]  6. Ankylosing spondylitis - stable  Jules Oar, MD  11:17 PM   Length of Stay: 2  Advanced Heart Failure Team Pager [phone] (M-F; 7a - 5p)  Please contact [facility] for night-coverage after hours (4p -7a ) and weekends on [email or web address]

## 2024-05-28 NOTE — H&P (View-Only) (Signed)
 Advanced Heart Failure Team Consult Note   Primary Physician: Pcp, No Cardiologist:  None  Reason for Consultation: Acute systolic HF  HPI:    Victor Willis is seen today for evaluation of acute systolic HF at the request of Dr. Gollan.   68 y/o male with ankylosing spondylitis, CKD, extensive tobacco and ETOH use. No previous known heart disease. Admitted with 3 week h/o HF symptoms with DOE, fatigue and LE edema. Denies CP  Has smoked 1/2-1ppd since age 20. Very heavy ETOH use. Brother says he has cut back to 1 drink per day but it is the size of 5 regular drinks.  Echo LVEF < 20% RV mild to moderately reduced.   SCr now 2.5-2.9   Hstrop negative  Home Medications Prior to Admission medications   Medication Sig Start Date End Date Taking? Authorizing Provider  cyclobenzaprine (FLEXERIL) 10 MG tablet Take 10 mg by mouth 2 (two) times daily. 09/19/19  Yes [provider]  lisinopril-hydrochlorothiazide (ZESTORETIC) 20-12.5 MG tablet Take 1 tablet by mouth daily. 05/21/24 05/21/25 Yes [provider]  metoprolol  succinate (TOPROL  XL) 25 MG 24 hr tablet Take 1 tablet (25 mg total) by mouth daily. 10/31/19 05/26/24 Yes Krishnan, Sendil K, MD  Multiple Vitamin (MULTI-VITAMIN) tablet Take 1 tablet by mouth daily.   Yes [provider]  oxycodone  (ROXICODONE ) 30 MG immediate release tablet Take 30 mg by mouth 5 (five) times daily. 10/23/19  Yes [provider]  sulfaSALAzine  (AZULFIDINE ) 500 MG tablet Take 500 mg by mouth 2 (two) times daily. 09/19/19  Yes [provider]  aspirin EC 81 MG tablet Take 81 mg by mouth every other day.  Patient not taking: Reported on 05/26/2024    [provider]  Multiple Vitamins-Minerals (MULTIVITAMIN GUMMIES ADULT PO) Take 2 each by mouth daily.    [provider]    Past Medical History: Past Medical History:  Diagnosis Date   Alcohol abuse    Ankylosing spondylitis (HCC)     Aortic atherosclerosis (HCC)    Chronic kidney disease (CKD), stage IV (severe) (HCC)    Coronary artery calcification    Hypertension    Macrocytic anemia     Past Surgical History: Past Surgical History:  Procedure Laterality Date   BACK SURGERY     TOTAL HIP ARTHROPLASTY Bilateral     Family History: Family History  Problem Relation Age of Onset   Diabetes Mellitus II Mother    Diabetes Mellitus II Father    Ankylosing spondylitis Daughter     Social History: Social History   Socioeconomic History   Marital status: Single    Spouse name: Not on file   Number of children: Not on file   Years of education: Not on file   Highest education level: Not on file  Occupational History   Not on file  Tobacco Use   Smoking status: Every Day   Smokeless tobacco: Never  Substance and Sexual Activity   Alcohol use: Yes    Comment: daily, heavy   Drug use: Not on file   Sexual activity: Not on file  Other Topics Concern   Not on file  Social History Narrative   Not on file   Social Drivers of Health   Financial Resource Strain: Patient Declined (04/09/2023)   Received from [facility]   Overall Financial Resource Strain (CARDIA)    Difficulty of Paying Living Expenses: Patient declined  Food Insecurity: No Food Insecurity (05/26/2024)  Hunger Vital Sign    Worried About Running Out of Food in the Last Year: Never true    Ran Out of Food in the Last Year: Never true  Transportation Needs: No Transportation Needs (05/26/2024)   PRAPARE - Administrator, Civil Service (Medical): No    Lack of Transportation (Non-Medical): No  Physical Activity: Not on file  Stress: Not on file  Social Connections: Socially Isolated (05/26/2024)   Social Connection and Isolation Panel    Frequency of Communication with Friends and Family: Once a week    Frequency of Social Gatherings with Friends and Family: Once a week    Attends Religious Services: 1 to 4  times per year    Active Member of [employer] or Organizations: Patient declined    Attends Banker Meetings: Never    Marital Status: Divorced    Allergies:  Allergies  Allergen Reactions   Celecoxib Rash, Anaphylaxis and Dermatitis   Atorvastatin Other (See Comments)    fatigue   Morphine  Other (See Comments)    PT STATE IT MAKE HIM FEEL WAY TOO LOOPY, CAN TOLERATE DILAUDID  VERY WELL     PT CAN ALSO TOLERATE HYDROCODONE VERY WELL   Prednisone Other (See Comments)    Throat swelling per patient    Objective:    Vital Signs:   Temp:  [97 F (36.1 C)-97.7 F (36.5 C)] 97.5 F (36.4 C) (06/18 1949) Pulse Rate:  [85-117] 92 (06/18 1949) Resp:  [18-20] 20 (06/18 1949) BP: (108-145)/(82-94) 145/92 (06/18 1949) SpO2:  [97 %-100 %] 98 % (06/18 1949) Weight:  [57.9 kg] 57.9 kg (06/18 0610)    Weight change: Filed Weights   05/26/24 2023 05/27/24 0601 05/28/24 0610  Weight: 59.8 kg 59.8 kg 57.9 kg    Intake/Output:   Intake/Output Summary (Last 24 hours) at 05/28/2024 2258 Last data filed at 05/28/2024 1935 Gross per 24 hour  Intake 240 ml  Output 1000 ml  Net -760 ml      Physical Exam    General:  Chronically ill appearing. No resp difficulty HEENT: normal Neck: supple. JVP to ear prominent v waves . Carotids 2+ bilat; no bruits. No lymphadenopathy or thyromegaly appreciated. Cor: Regular rate & rhythm. No rubs, gallops or murmurs. Lungs:markedly decreased throughout Abdomen: soft, nontender, + distended. No hepatosplenomegaly. No bruits or masses. Good bowel sounds. Extremities: no cyanosis, clubbing, rash, 2+ edema + compression hose. War,  Neuro: alert & orientedx3, cranial nerves grossly intact. moves all 4 extremities w/o difficulty. Affect pleasant   Telemetry   Sinus 80-90s Personally reviewed   EKG    Sinus tach 119 LVH with repol qrs Personally reviewed   Labs   Basic Metabolic Panel: Recent Labs  Lab 05/26/24 1414  05/26/24 2059 05/27/24 0532 05/28/24 0601 05/28/24 1438  NA 139  --  138 138  --   K 5.3*  --  5.6* 6.0* 5.0  CL 110  --  110 109  --   CO2 18*  --  20* 20*  --   GLUCOSE 171*  --  123* 129*  --   BUN 51*  --  60* 58*  --   CREATININE 2.66* 2.78* 2.91* 2.71*  --   CALCIUM 8.1*  --  8.2* 8.5*  --     Liver Function Tests: Recent Labs  Lab 05/28/24 0601  AST 22  ALT 11  ALKPHOS 63  BILITOT 0.5  PROT 6.4*  ALBUMIN 2.9*  No results for input(s): LIPASE, AMYLASE in the last 168 hours. No results for input(s): AMMONIA in the last 168 hours.  CBC: Recent Labs  Lab 05/26/24 1414 05/26/24 2059 05/27/24 0532  WBC 8.0 10.0 8.1  HGB 10.8* 9.9* 10.7*  HCT 34.1* 31.3* 32.9*  MCV 100.9* 101.0* 100.6*  PLT 291 273 284    Cardiac Enzymes: No results for input(s): CKTOTAL, CKMB, CKMBINDEX, TROPONINI in the last 168 hours.  BNP: BNP (last 3 results) Recent Labs    05/26/24 1414  BNP 3,571.6*    ProBNP (last 3 results) No results for input(s): PROBNP in the last 8760 hours.   CBG: No results for input(s): GLUCAP in the last 168 hours.  Coagulation Studies: Recent Labs    05/27/24 0532  LABPROT 15.4*  INR 1.2     Imaging   ECHOCARDIOGRAM COMPLETE Result Date: 05/28/2024    ECHOCARDIOGRAM REPORT   Patient Name:   Victor Willis Date of Exam: 05/28/2024 Medical Rec #:  [id number]           Height:       60.0 in Accession #:    [id number]          Weight:       127.7 lb Date of Birth:  November 16, 1956           BSA:          1.543 m Patient Age:    68 years            BP:           128/89 mmHg Patient Gender: M                   HR:           99 bpm. Exam Location:  [facility] Procedure: 2D Echo, Cardiac Doppler, Color Doppler, Strain Analysis and 3D Echo            (Both Spectral and Color Flow Doppler were utilized during            procedure). Indications:     CHF-acute diastolic I50.31  History:         Patient has no prior history of Echocardiogram  examinations.                  Risk Factors:Hypertension. CKD.  Sonographer:     Broadus Canes Referring Phys:  [id number] SONA PATEL Diagnosing Phys: Belva Boyden MD  Sonographer Comments: Global longitudinal strain was attempted. IMPRESSIONS  1. Left ventricular ejection fraction, by estimation, is <20%. Left ventricular ejection fraction by 3D volume is 11 %. Left ventricular ejection fraction by PLAX is 12 %. The left ventricle has severely decreased function. The left ventricle demonstrates global hypokinesis. The left ventricular internal cavity size was moderately dilated. Left ventricular diastolic parameters are consistent with Grade II diastolic dysfunction (pseudonormalization).  2. Right ventricular systolic function is normal. The right ventricular size is normal. There is moderately elevated pulmonary artery systolic pressure. The estimated right ventricular systolic pressure is 47.8 mmHg.  3. Left atrial size was moderately dilated.  4. The mitral valve is normal in structure. Moderate mitral valve regurgitation. No evidence of mitral stenosis.  5. Tricuspid valve regurgitation is mild to moderate.  6. The aortic valve is tricuspid. Aortic valve regurgitation is not visualized. No aortic stenosis is present.  7. The inferior vena cava is normal in size with greater than 50% respiratory variability, suggesting right atrial pressure of 3  mmHg. FINDINGS  Left Ventricle: Left ventricular ejection fraction, by estimation, is <20%. Left ventricular ejection fraction by PLAX is 12 %. Left ventricular ejection fraction by 3D volume is 11 %. The left ventricle has severely decreased function. The left ventricle demonstrates global hypokinesis. The left ventricular internal cavity size was moderately dilated. There is no left ventricular hypertrophy. Left ventricular diastolic parameters are consistent with Grade II diastolic dysfunction (pseudonormalization). Right Ventricle: The right ventricular size is normal. No  increase in right ventricular wall thickness. Right ventricular systolic function is normal. There is moderately elevated pulmonary artery systolic pressure. The tricuspid regurgitant velocity is 3.27 m/s, and with an assumed right atrial pressure of 5 mmHg, the estimated right ventricular systolic pressure is 47.8 mmHg. Left Atrium: Left atrial size was moderately dilated. Right Atrium: Right atrial size was normal in size. Pericardium: There is no evidence of pericardial effusion. Mitral Valve: The mitral valve is normal in structure. Moderate mitral valve regurgitation. No evidence of mitral valve stenosis. Tricuspid Valve: The tricuspid valve is normal in structure. Tricuspid valve regurgitation is mild to moderate. No evidence of tricuspid stenosis. Aortic Valve: The aortic valve is tricuspid. Aortic valve regurgitation is not visualized. No aortic stenosis is present. Aortic valve mean gradient measures 2.0 mmHg. Aortic valve peak gradient measures 4.5 mmHg. Aortic valve area, by VTI measures 1.59 cm. Pulmonic Valve: The pulmonic valve was normal in structure. Pulmonic valve regurgitation is not visualized. No evidence of pulmonic stenosis. Aorta: The aortic root is normal in size and structure. Venous: The inferior vena cava is normal in size with greater than 50% respiratory variability, suggesting right atrial pressure of 3 mmHg. IAS/Shunts: No atrial level shunt detected by color flow Doppler. Additional Comments: 3D was performed not requiring image post processing on an independent workstation and was abnormal.  LEFT VENTRICLE PLAX 2D LV EF:         Left            Diastology                ventricular     LV e' medial:    3.15 cm/s                ejection        LV E/e' medial:  30.6                fraction by     LV e' lateral:   7.29 cm/s                PLAX is 12      LV E/e' lateral: 13.2                %. LVIDd:         5.70 cm         2D Longitudinal LVIDs:         5.40 cm         Strain LV PW:          1.00 cm         2D Strain GLS   -3.5 % LV IVS:        1.70 cm         Avg: LVOT diam:     2.30 cm LV SV:         24              3D Volume EF LV SV Index:   16  LV 3D EF:    Left LVOT Area:     4.15 cm                     ventricul                                             ar                                             ejection LV Volumes (MOD)                            fraction LV vol d, MOD    252.0 ml                   by 3D A4C:                                        volume is LV vol s, MOD    230.0 ml                   11 %. A4C: LV SV MOD A4C:   252.0 ml                                3D Volume EF:                                3D EF:        11 % RIGHT VENTRICLE RV Basal diam:  3.40 cm RV Mid diam:    2.50 cm RV S prime:     11.40 cm/s TAPSE (M-mode): 1.5 cm LEFT ATRIUM              Index        RIGHT ATRIUM           Index LA diam:        5.10 cm  3.31 cm/m   RA Area:     19.10 cm LA Vol (A2C):   101.0 ml 65.48 ml/m  RA Volume:   50.70 ml  32.87 ml/m LA Vol (A4C):   74.0 ml  47.97 ml/m LA Biplane Vol: 92.1 ml  59.71 ml/m  AORTIC VALVE AV Area (Vmax):    1.63 cm AV Area (Vmean):   1.52 cm AV Area (VTI):     1.59 cm AV Vmax:           105.50 cm/s AV Vmean:          70.700 cm/s AV VTI:            0.152 m AV Peak Grad:      4.5 mmHg AV Mean Grad:      2.0 mmHg LVOT Vmax:         41.50 cm/s LVOT Vmean:        25.800 cm/s LVOT VTI:          0.058 m LVOT/AV VTI ratio: 0.38  AORTA Ao  Root diam: 2.70 cm MITRAL VALVE                  TRICUSPID VALVE MV Area (PHT): 6.37 cm       TR Peak grad:   42.8 mmHg MV Decel Time: 119 msec       TR Vmax:        327.00 cm/s MR Peak grad:    83.9 mmHg MR Mean grad:    53.0 mmHg    SHUNTS MR Vmax:         458.00 cm/s  Systemic VTI:  0.06 m MR Vmean:        340.0 cm/s   Systemic Diam: 2.30 cm MR PISA:         3.08 cm MR PISA Eff ROA: 24 mm MR PISA Radius:  0.70 cm MV E velocity: 96.40 cm/s MV A velocity: 58.70 cm/s MV E/A ratio:  1.64 Belva Boyden  MD Electronically signed by Belva Boyden MD Signature Date/Time: 05/28/2024/1:08:47 PM    Final      Medications:     Current Medications:  aspirin EC  81 mg Oral QODAY   furosemide  80 mg Intravenous BID   heparin   5,000 Units Subcutaneous Q8H   nicotine   21 mg Transdermal Daily   oxycodone   30 mg Oral 5 X Daily   patiromer  25.2 g Oral Daily   polyethylene glycol  17 g Oral Daily   sulfaSALAzine   500 mg Oral BID    Infusions:    Assessment/Plan   1. Acute systolic HF - Echo 6/25 EF < 20 RV mild to moderately reduced - Suspect ETOH CM but also at high risk for underlying CAD - NYHA IV  - Volume status markedly elevated - situation c/b CKD IV - Overall very tenuous - Not candidate for coronary angiography with CKD IV. Refuses MRI - Not candidate for advanced therapies due to severe lung and kidney disease - Will increase lasix to 80 IV bid and hold b-blocker. If no response will move to ICU for inotropic support to facilitate diuresis - I worry he is getting close to end-stage  2. AKI on CKD IV -baseline 2.0-22 - now 2.9 suspect cardiorenal - daily BMET management as above - Renal team following  3. COPD with ongoing tobacco use - severe on exam - discussed need for cessation  4. ETOH abuse - watch for WD - discussed need for cessation  5. Hyperkalemia - Renal managing with [place]  6. Ankylosing spondylitis - stable  Jules Oar, MD  11:17 PM   Length of Stay: 2  Advanced Heart Failure Team Pager [phone] (M-F; 7a - 5p)  Please contact [facility] for night-coverage after hours (4p -7a ) and weekends on [email or web address]

## 2024-05-28 NOTE — Progress Notes (Signed)
 [facility] [place], [place] 05/28/24  Subjective:   Hospital day # 2  Patient known to our practice from outpatient follow-up for CKD.  He was last seen in March 2024 by Dr. Rhesa Celeste.  He has diabetic nephropathy, hypertension and chronic hyperkalemia.  Patient was lost to follow-up.  He presented to the emergency room on Monday for abnormal labs specifically high potassium.  Upon first presentation his potassium was found to be 5.3.  Outpatient potassium on June 11 was 6.4. His baseline creatinine appears to be between 2.0-2.3 with GFR ranging from 30-36.  Admission creatinine is 2.66 which has worsened to 2.91 today with corresponding GFR of 29.  Potassium remains elevated at 5.6. Patient also has mild anemia with hemoglobin of 10.7. Multiple family members are at bedside today when seen.  Update:  Creatinine currently 2.7 which is still slightly above his prior baseline. Still has hyperkalemia with serum potassium of 6. Case discussed with Dr. Lydia Sams and we agreed to add Veltassa to his medication regimen.     Objective:  Vital signs in last 24 hours:  Temp:  [97 F (36.1 C)-98 F (36.7 C)] 97.6 F (36.4 C) (06/18 1611) Pulse Rate:  [85-117] 89 (06/18 1611) Resp:  [18] 18 (06/18 0549) BP: (108-141)/(82-94) 122/82 (06/18 1611) SpO2:  [97 %-100 %] 99 % (06/18 1611) Weight:  [57.9 kg] 57.9 kg (06/18 0610)  Weight change: -1.043 kg Filed Weights   05/26/24 2023 05/27/24 0601 05/28/24 0610  Weight: 59.8 kg 59.8 kg 57.9 kg    Intake/Output:    Intake/Output Summary (Last 24 hours) at 05/28/2024 1654 Last data filed at 05/28/2024 1025 Gross per 24 hour  Intake 240 ml  Output 300 ml  Net -60 ml     Physical Exam: General: No acute distress  HEENT Moist oral mucous membranes, anicteric  Pulm/lungs Normal breathing effort on room air, clear to auscultation  CVS/Heart S1S2 no rubs  Abdomen:  Soft, nontender  Extremities: 1+ peripheral edema  bilaterally  Neurologic: Alert and oriented  Skin: No acute rashes          Basic Metabolic Panel:  Recent Labs  Lab 05/26/24 1414 05/26/24 2059 05/27/24 0532 05/28/24 0601 05/28/24 1438  NA 139  --  138 138  --   K 5.3*  --  5.6* 6.0* 5.0  CL 110  --  110 109  --   CO2 18*  --  20* 20*  --   GLUCOSE 171*  --  123* 129*  --   BUN 51*  --  60* 58*  --   CREATININE 2.66* 2.78* 2.91* 2.71*  --   CALCIUM 8.1*  --  8.2* 8.5*  --      CBC: Recent Labs  Lab 05/26/24 1414 05/26/24 2059 05/27/24 0532  WBC 8.0 10.0 8.1  HGB 10.8* 9.9* 10.7*  HCT 34.1* 31.3* 32.9*  MCV 100.9* 101.0* 100.6*  PLT 291 273 284     No results found for: HEPBSAG, HEPBSAB, HEPBIGM    Microbiology:  No results found for this or any previous visit (from the past 240 hours).  Coagulation Studies: Recent Labs    05/27/24 0532  LABPROT 15.4*  INR 1.2    Urinalysis: No results for input(s): COLORURINE, LABSPEC, PHURINE, GLUCOSEU, HGBUR, BILIRUBINUR, KETONESUR, PROTEINUR, UROBILINOGEN, NITRITE, LEUKOCYTESUR in the last 72 hours.  Invalid input(s): APPERANCEUR    Imaging: ECHOCARDIOGRAM COMPLETE Result Date: 05/28/2024    ECHOCARDIOGRAM REPORT   Patient Name:   Victor  MICHAEL Willis Date of Exam: 05/28/2024 Medical Rec #:  [id number]           Height:       60.0 in Accession #:    [id number]          Weight:       127.7 lb Date of Birth:  [birth year]           BSA:          1.543 m Patient Age:    68 years            BP:           128/89 mmHg Patient Gender: M                   HR:           99 bpm. Exam Location:  [facility] Procedure: 2D Echo, Cardiac Doppler, Color Doppler, Strain Analysis and 3D Echo            (Both Spectral and Color Flow Doppler were utilized during            procedure). Indications:     CHF-acute diastolic I50.31  History:         Patient has no prior history of Echocardiogram examinations.                  Risk Factors:Hypertension. CKD.   Sonographer:     Broadus Canes Referring Phys:  [id number] SONA PATEL Diagnosing Phys: Belva Boyden MD  Sonographer Comments: Global longitudinal strain was attempted. IMPRESSIONS  1. Left ventricular ejection fraction, by estimation, is <20%. Left ventricular ejection fraction by 3D volume is 11 %. Left ventricular ejection fraction by PLAX is 12 %. The left ventricle has severely decreased function. The left ventricle demonstrates global hypokinesis. The left ventricular internal cavity size was moderately dilated. Left ventricular diastolic parameters are consistent with Grade II diastolic dysfunction (pseudonormalization).  2. Right ventricular systolic function is normal. The right ventricular size is normal. There is moderately elevated pulmonary artery systolic pressure. The estimated right ventricular systolic pressure is 47.8 mmHg.  3. Left atrial size was moderately dilated.  4. The mitral valve is normal in structure. Moderate mitral valve regurgitation. No evidence of mitral stenosis.  5. Tricuspid valve regurgitation is mild to moderate.  6. The aortic valve is tricuspid. Aortic valve regurgitation is not visualized. No aortic stenosis is present.  7. The inferior vena cava is normal in size with greater than 50% respiratory variability, suggesting right atrial pressure of 3 mmHg. FINDINGS  Left Ventricle: Left ventricular ejection fraction, by estimation, is <20%. Left ventricular ejection fraction by PLAX is 12 %. Left ventricular ejection fraction by 3D volume is 11 %. The left ventricle has severely decreased function. The left ventricle demonstrates global hypokinesis. The left ventricular internal cavity size was moderately dilated. There is no left ventricular hypertrophy. Left ventricular diastolic parameters are consistent with Grade II diastolic dysfunction (pseudonormalization). Right Ventricle: The right ventricular size is normal. No increase in right ventricular wall thickness. Right ventricular  systolic function is normal. There is moderately elevated pulmonary artery systolic pressure. The tricuspid regurgitant velocity is 3.27 m/s, and with an assumed right atrial pressure of 5 mmHg, the estimated right ventricular systolic pressure is 47.8 mmHg. Left Atrium: Left atrial size was moderately dilated. Right Atrium: Right atrial size was normal in size. Pericardium: There is no evidence of pericardial effusion. Mitral Valve: The mitral valve is normal in structure.  Moderate mitral valve regurgitation. No evidence of mitral valve stenosis. Tricuspid Valve: The tricuspid valve is normal in structure. Tricuspid valve regurgitation is mild to moderate. No evidence of tricuspid stenosis. Aortic Valve: The aortic valve is tricuspid. Aortic valve regurgitation is not visualized. No aortic stenosis is present. Aortic valve mean gradient measures 2.0 mmHg. Aortic valve peak gradient measures 4.5 mmHg. Aortic valve area, by VTI measures 1.59 cm. Pulmonic Valve: The pulmonic valve was normal in structure. Pulmonic valve regurgitation is not visualized. No evidence of pulmonic stenosis. Aorta: The aortic root is normal in size and structure. Venous: The inferior vena cava is normal in size with greater than 50% respiratory variability, suggesting right atrial pressure of 3 mmHg. IAS/Shunts: No atrial level shunt detected by color flow Doppler. Additional Comments: 3D was performed not requiring image post processing on an independent workstation and was abnormal.  LEFT VENTRICLE PLAX 2D LV EF:         Left            Diastology                ventricular     LV e' medial:    3.15 cm/s                ejection        LV E/e' medial:  30.6                fraction by     LV e' lateral:   7.29 cm/s                PLAX is 12      LV E/e' lateral: 13.2                %. LVIDd:         5.70 cm         2D Longitudinal LVIDs:         5.40 cm         Strain LV PW:         1.00 cm         2D Strain GLS   -3.5 % LV IVS:         1.70 cm         Avg: LVOT diam:     2.30 cm LV SV:         24              3D Volume EF LV SV Index:   16              LV 3D EF:    Left LVOT Area:     4.15 cm                     ventricul                                             ar                                             ejection LV Volumes (MOD)  fraction LV vol d, MOD    252.0 ml                   by 3D A4C:                                        volume is LV vol s, MOD    230.0 ml                   11 %. A4C: LV SV MOD A4C:   252.0 ml                                3D Volume EF:                                3D EF:        11 % RIGHT VENTRICLE RV Basal diam:  3.40 cm RV Mid diam:    2.50 cm RV S prime:     11.40 cm/s TAPSE (M-mode): 1.5 cm LEFT ATRIUM              Index        RIGHT ATRIUM           Index LA diam:        5.10 cm  3.31 cm/m   RA Area:     19.10 cm LA Vol (A2C):   101.0 ml 65.48 ml/m  RA Volume:   50.70 ml  32.87 ml/m LA Vol (A4C):   74.0 ml  47.97 ml/m LA Biplane Vol: 92.1 ml  59.71 ml/m  AORTIC VALVE AV Area (Vmax):    1.63 cm AV Area (Vmean):   1.52 cm AV Area (VTI):     1.59 cm AV Vmax:           105.50 cm/s AV Vmean:          70.700 cm/s AV VTI:            0.152 m AV Peak Grad:      4.5 mmHg AV Mean Grad:      2.0 mmHg LVOT Vmax:         41.50 cm/s LVOT Vmean:        25.800 cm/s LVOT VTI:          0.058 m LVOT/AV VTI ratio: 0.38  AORTA Ao Root diam: 2.70 cm MITRAL VALVE                  TRICUSPID VALVE MV Area (PHT): 6.37 cm       TR Peak grad:   42.8 mmHg MV Decel Time: 119 msec       TR Vmax:        327.00 cm/s MR Peak grad:    83.9 mmHg MR Mean grad:    53.0 mmHg    SHUNTS MR Vmax:         458.00 cm/s  Systemic VTI:  0.06 m MR Vmean:        340.0 cm/s   Systemic Diam: 2.30 cm MR PISA:         3.08 cm MR PISA Eff ROA: 24 mm MR PISA Radius:  0.70 cm MV E velocity: 96.40 cm/s MV A velocity: 58.70 cm/s MV E/A  ratio:  1.64 Timothy Gollan MD Electronically signed by Belva Boyden MD Signature  Date/Time: 05/28/2024/1:08:47 PM    Final      Medications:     aspirin EC  81 mg Oral QODAY   furosemide  80 mg Intravenous BID   heparin   5,000 Units Subcutaneous Q8H   nicotine   21 mg Transdermal Daily   oxycodone   30 mg Oral 5 X Daily   patiromer  25.2 g Oral Daily   polyethylene glycol  17 g Oral Daily   sulfaSALAzine   500 mg Oral BID   albuterol  Assessment/ Plan:  68 y.o. 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 He also has chronic hyperkalemia.  He was taking lisinopril/HCTZ at home which can contribute to high potassium.  He denies drinking excessive orange juice but does eat potatoes/chips etc.  He stopped taking Lokelma and Farxiga due to cost issues.  Plan: Creatinine currently 2.7 and still above his prior baseline.  He also continues to have significant chronic systolic heart failure with ejection fraction less than 20%.  Placed back on Lasix 80 mg twice daily.  Given ongoing hyperkalemia add Veltassa 25.2 g daily.  Continue to monitor serum potassium closely.  No immediate need for dialysis at the moment.  Appreciate cardiology recommendations.   LOS: 2 Cyana Shook 6/18/20254:54 PM  [facility] [address] [place], [place] [phone]  Note: This note was prepared with Dragon dictation. Any transcription errors are unintentional

## 2024-05-28 NOTE — Progress Notes (Signed)
 Patient sitting in room with son at bedside. Patient asked when am I going to the ICU?Aaron Aas Notified patient there were no orders for that at this time. Pt then stated that that doctor told me I was going over there. I asked the patient to clarify and he stated the heart doctor wanted me to try more lasix and then I will go to the ICU because my heart isn't beating good enough. Educated the patient that I was about to give him 80mg  of IV lasix so that's probably what the MD was referring to and that if I got orders for anything else I would let him know. Pt's son at bedside asking questions with his mother on the phone on speaker phone. Questions answered to the best of  my ability and pt's wife on the phone stated well I'm on my way up there and they aren't going to like it . Educated pt, son and wife that she was welcome to come to the floor but she would need to be polite like everyone else. Pt's son stated he had another question and then stated he forgot. Educated to call if he remembered the question. Pt stated he was going to get washed up in the bathroom. Pt is alert and oriented x4, up ad lib. Pt verbalizes understanding to call staff if any needs. Notified John CN of the above and Dr Lydia Sams of the patient's concerns.

## 2024-05-28 NOTE — Progress Notes (Signed)
 [facility]  - [place] [facility]   PATIENT NAME: Victor Willis    MR#:  [id number]  DATE OF BIRTH:  August 26, 1956  SUBJECTIVE:  patient's daughter at bedside. Patient urinating well. Sitting at the edge of the bed. Denies respiratory distress sats more than 92% on room air. Leg swelling improving.  VITALS:  Blood pressure (!) 108/94, pulse 85, temperature (!) 97 F (36.1 C), resp. rate 18, height 5' (1.524 m), weight 57.9 kg, SpO2 97%.  PHYSICAL EXAMINATION:   GENERAL:  68 y.o.-year-old patient with no acute distress.  LUNGS: decreased breath sounds bilaterally, no wheezing CARDIOVASCULAR: S1, S2 normal. No murmur   ABDOMEN: Soft, nontender, nondistended. Bowel sounds present.  EXTREMITIES: ++edema b/l.   Teds+ NEUROLOGIC: nonfocal  patient is alert and awake   LABORATORY PANEL:  CBC Recent Labs  Lab 05/27/24 0532  WBC 8.1  HGB 10.7*  HCT 32.9*  PLT 284    Chemistries  Recent Labs  Lab 05/28/24 0601  NA 138  K 6.0*  CL 109  CO2 20*  GLUCOSE 129*  BUN 58*  CREATININE 2.71*  CALCIUM 8.5*  AST 22  ALT 11  ALKPHOS 63  BILITOT 0.5   Cardiac Enzymes No results for input(s): TROPONINI in the last 168 hours. RADIOLOGY:  ECHOCARDIOGRAM COMPLETE Result Date: 05/28/2024    ECHOCARDIOGRAM REPORT   Patient Name:   Victor Willis Date of Exam: 05/28/2024 Medical Rec #:  [id number]           Height:       60.0 in Accession #:    [id number]          Weight:       127.7 lb Date of Birth:  [birth year]           BSA:          1.543 m Patient Age:    68 years            BP:           128/89 mmHg Patient Gender: M                   HR:           99 bpm. Exam Location:  [facility] Procedure: 2D Echo, Cardiac Doppler, Color Doppler, Strain Analysis and 3D Echo            (Both Spectral and Color Flow Doppler were utilized during            procedure). Indications:     CHF-acute diastolic I50.31  History:         Patient has no prior history of Echocardiogram  examinations.                  Risk Factors:Hypertension. CKD.  Sonographer:     Broadus Canes Referring Phys:  [id number] Demonte Dobratz Diagnosing Phys: Belva Boyden MD  Sonographer Comments: Global longitudinal strain was attempted. IMPRESSIONS  1. Left ventricular ejection fraction, by estimation, is <20%. Left ventricular ejection fraction by 3D volume is 11 %. Left ventricular ejection fraction by PLAX is 12 %. The left ventricle has severely decreased function. The left ventricle demonstrates global hypokinesis. The left ventricular internal cavity size was moderately dilated. Left ventricular diastolic parameters are consistent with Grade II diastolic dysfunction (pseudonormalization).  2. Right ventricular systolic function is normal. The right ventricular size is normal. There is moderately elevated pulmonary artery systolic pressure. The estimated right ventricular systolic pressure  is 47.8 mmHg.  3. Left atrial size was moderately dilated.  4. The mitral valve is normal in structure. Moderate mitral valve regurgitation. No evidence of mitral stenosis.  5. Tricuspid valve regurgitation is mild to moderate.  6. The aortic valve is tricuspid. Aortic valve regurgitation is not visualized. No aortic stenosis is present.  7. The inferior vena cava is normal in size with greater than 50% respiratory variability, suggesting right atrial pressure of 3 mmHg. FINDINGS  Left Ventricle: Left ventricular ejection fraction, by estimation, is <20%. Left ventricular ejection fraction by PLAX is 12 %. Left ventricular ejection fraction by 3D volume is 11 %. The left ventricle has severely decreased function. The left ventricle demonstrates global hypokinesis. The left ventricular internal cavity size was moderately dilated. There is no left ventricular hypertrophy. Left ventricular diastolic parameters are consistent with Grade II diastolic dysfunction (pseudonormalization). Right Ventricle: The right ventricular size is normal. No  increase in right ventricular wall thickness. Right ventricular systolic function is normal. There is moderately elevated pulmonary artery systolic pressure. The tricuspid regurgitant velocity is 3.27 m/s, and with an assumed right atrial pressure of 5 mmHg, the estimated right ventricular systolic pressure is 47.8 mmHg. Left Atrium: Left atrial size was moderately dilated. Right Atrium: Right atrial size was normal in size. Pericardium: There is no evidence of pericardial effusion. Mitral Valve: The mitral valve is normal in structure. Moderate mitral valve regurgitation. No evidence of mitral valve stenosis. Tricuspid Valve: The tricuspid valve is normal in structure. Tricuspid valve regurgitation is mild to moderate. No evidence of tricuspid stenosis. Aortic Valve: The aortic valve is tricuspid. Aortic valve regurgitation is not visualized. No aortic stenosis is present. Aortic valve mean gradient measures 2.0 mmHg. Aortic valve peak gradient measures 4.5 mmHg. Aortic valve area, by VTI measures 1.59 cm. Pulmonic Valve: The pulmonic valve was normal in structure. Pulmonic valve regurgitation is not visualized. No evidence of pulmonic stenosis. Aorta: The aortic root is normal in size and structure. Venous: The inferior vena cava is normal in size with greater than 50% respiratory variability, suggesting right atrial pressure of 3 mmHg. IAS/Shunts: No atrial level shunt detected by color flow Doppler. Additional Comments: 3D was performed not requiring image post processing on an independent workstation and was abnormal.  LEFT VENTRICLE PLAX 2D LV EF:         Left            Diastology                ventricular     LV e' medial:    3.15 cm/s                ejection        LV E/e' medial:  30.6                fraction by     LV e' lateral:   7.29 cm/s                PLAX is 12      LV E/e' lateral: 13.2                %. LVIDd:         5.70 cm         2D Longitudinal LVIDs:         5.40 cm         Strain LV PW:          1.00 cm  2D Strain GLS   -3.5 % LV IVS:        1.70 cm         Avg: LVOT diam:     2.30 cm LV SV:         24              3D Volume EF LV SV Index:   16              LV 3D EF:    Left LVOT Area:     4.15 cm                     ventricul                                             ar                                             ejection LV Volumes (MOD)                            fraction LV vol d, MOD    252.0 ml                   by 3D A4C:                                        volume is LV vol s, MOD    230.0 ml                   11 %. A4C: LV SV MOD A4C:   252.0 ml                                3D Volume EF:                                3D EF:        11 % RIGHT VENTRICLE RV Basal diam:  3.40 cm RV Mid diam:    2.50 cm RV S prime:     11.40 cm/s TAPSE (M-mode): 1.5 cm LEFT ATRIUM              Index        RIGHT ATRIUM           Index LA diam:        5.10 cm  3.31 cm/m   RA Area:     19.10 cm LA Vol (A2C):   101.0 ml 65.48 ml/m  RA Volume:   50.70 ml  32.87 ml/m LA Vol (A4C):   74.0 ml  47.97 ml/m LA Biplane Vol: 92.1 ml  59.71 ml/m  AORTIC VALVE AV Area (Vmax):    1.63 cm AV Area (Vmean):   1.52 cm AV Area (VTI):     1.59 cm AV Vmax:           105.50 cm/s AV Vmean:  70.700 cm/s AV VTI:            0.152 m AV Peak Grad:      4.5 mmHg AV Mean Grad:      2.0 mmHg LVOT Vmax:         41.50 cm/s LVOT Vmean:        25.800 cm/s LVOT VTI:          0.058 m LVOT/AV VTI ratio: 0.38  AORTA Ao Root diam: 2.70 cm MITRAL VALVE                  TRICUSPID VALVE MV Area (PHT): 6.37 cm       TR Peak grad:   42.8 mmHg MV Decel Time: 119 msec       TR Vmax:        327.00 cm/s MR Peak grad:    83.9 mmHg MR Mean grad:    53.0 mmHg    SHUNTS MR Vmax:         458.00 cm/s  Systemic VTI:  0.06 m MR Vmean:        340.0 cm/s   Systemic Diam: 2.30 cm MR PISA:         3.08 cm MR PISA Eff ROA: 24 mm MR PISA Radius:  0.70 cm MV E velocity: 96.40 cm/s MV A velocity: 58.70 cm/s MV E/A ratio:  1.64 Belva Boyden  MD Electronically signed by Belva Boyden MD Signature Date/Time: 05/28/2024/1:08:47 PM    Final    DG Chest 2 View Result Date: 05/26/2024 CLINICAL DATA:  Shortness of breath EXAM: CHEST - 2 VIEW COMPARISON:  None Available. FINDINGS: Underinflation. Small bilateral pleural effusions are seen, right-greater-than-left adjacent opacities. Enlarged cardiopericardial silhouette. Tortuous ectatic aorta. Interstitial prominence noted. No pneumothorax. Frontal view is rotated to the right. Osteopenia. Diffuse bridging syndesmophytes. Air-fluid level along the stomach beneath the left hemidiaphragm. IMPRESSION: Hyperinflation. Pleural effusions, right-greater-than-left. Enlarged heart. Electronically Signed   By: Adrianna Horde M.D.   On: 05/26/2024 14:51   Assessment and Plan   Mindy Behnken is a 68 y.o. male with a hx of coronary artery calcification noted on CT imaging, CKD stage IV, DM2, aortic atherosclerosis, HTN, prior heavy alcohol use with history of alcoholic pancreatitis, ongoing tobacco use, ankylosing spondylitis, macrocytic anemia, diverticulosis, and prior back and hip surgery came in after he was asked by orthopedic surgery to come to the emergency room for potassium 6.4.  Hyperkalemia acute on chronic kidney disease stage IV/type II diabetes/hypertension -- patient's potassium is down to 5.6 -- nephrology consultation with Dr. Christain Courser on [facility] -- receiving IV Lasix monitor input output and metabolic panel --?Cardiorenal syndrome --K 6.0--d/w Dr Lateef--recommends Bennye Bravo bid--will check K again --renal diet  Volume overload/fluid overload/acute systolic congestive heart failure severe cardiomyopathy with EF of 20% -- IV Lasix BID monitor input output creatinine -- cardiology consultation with [facility] cardiology -- Echo shows EF of less than 20%. Global dyskinesia -- daily weights -- advance heart failure team to see patient  History of atrial tachycardia -- continue  metoprolol   History of smoking and alcohol use -- recommend cessation  Elevated troponin appears demand ischemia in the setting of CKD stage IV -- no chest pain -- continue aspirin    Procedures: Family communication : daughter Consults : [facility] cardiology, nephrology CODE STATUS: full DVT Prophylaxis : heparin  Level of care: Telemetry Cardiac Status is: Inpatient Remains inpatient appropriate because: CHF, acute on chronic CKD    TOTAL TIME TAKING  CARE OF THIS PATIENT: 45 minutes.  >50% time spent on counselling and coordination of care  Note: This dictation was prepared with Dragon dictation along with smaller phrase technology. Any transcriptional errors that result from this process are unintentional.  Melvinia Stager M.D    [facility]   CC: Primary care physician; Pcp, No

## 2024-05-28 NOTE — TOC Initial Note (Signed)
 Transition of Care [facility]) - Initial/Assessment Note    Patient Details  Name: Victor Willis MRN: [id number] Date of Birth: May 24, 1956  [facility]) CM/SW Contact:    Rajendra Spiller C Dione Petron, RN Phone Number: 05/28/2024, 12:48 PM  Clinical Narrative:                 No PCP listed PCP list added to AVS.        Patient Goals and CMS Choice            Expected Discharge Plan and Services                                              Prior Living Arrangements/Services                       Activities of Daily Living   ADL Screening (condition at time of admission) Independently performs ADLs?: Yes (appropriate for developmental age) Is the patient deaf or have difficulty hearing?: No Does the patient have difficulty seeing, even when wearing glasses/contacts?: No Does the patient have difficulty concentrating, remembering, or making decisions?: No  Permission Sought/Granted                  Emotional Assessment              Admission diagnosis:  Shortness of breath [R06.02] Lower extremity edema [R60.0] Pleural effusion [J90] Fluid overload [E87.70] Hypervolemia, unspecified hypervolemia type [E87.70] Patient Active Problem List   Diagnosis Date Noted   Lower extremity edema 05/27/2024   Hyperkalemia 05/27/2024   CKD (chronic kidney disease), stage IV (HCC) 05/27/2024   Fluid overload 05/26/2024   AKI (acute kidney injury) (HCC) 05/26/2024   Obesity (BMI 30-39.9) 10/31/2019   Tobacco abuse 10/31/2019   Ankylosing spondylitis (HCC) 10/29/2019   Essential hypertension 10/29/2019   Acute pancreatitis 10/29/2019   Alcohol abuse 10/29/2019   Hypertensive urgency 10/29/2019   PCP:  Pcp, No Pharmacy:   [id number] [place], [place] - [address] AT [facility] [address] [place] [address] Phone: [phone] Fax: [phone]     Social Drivers of Health  (SDOH) Social History: SDOH Screenings   Food Insecurity: No Food Insecurity (05/26/2024)  Housing: Low Risk  (05/26/2024)  Transportation Needs: No Transportation Needs (05/26/2024)  Utilities: Not At Risk (05/26/2024)  Financial Resource Strain: Patient Declined (04/09/2023)   Received from [facility]  Social Connections: Socially Isolated (05/26/2024)  Tobacco Use: High Risk (05/27/2024)   SDOH Interventions:     Readmission Risk Interventions     No data to display

## 2024-05-28 NOTE — Progress Notes (Signed)
   Advanced HF Consult   Patient seen and examined. Full consult pending.  68 y/o male with ankylosing spondylitis, CKD, extensive tobacco and ETOH use. Admitted with 3 week h/o HF symptoms  Echo LVEF < 20% RV mild to moderate reduced.   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 Increase lasix to 80 IV bid. If no response will need to place central access and start inotropic support.  Not cath candidate currently due to CKD. Unable to tolerate cMRI.   Jules Oar, MD  4:06 PM

## 2024-05-28 NOTE — Care Management Important Message (Signed)
 Important Message  Patient Details  Name: Joahan Swatzell Zabawa MRN: [id number] Date of Birth: 06-Nov-1956   Important Message Given:  Yes - Medicare IM     Anise Kerns 05/28/2024, 1:02 PM

## 2024-05-28 NOTE — Progress Notes (Signed)
 Provided pt and daughter at bedside with education on heart failure, fluid restrictions, how to count liquid, strategies for day to day living and the medications given this AM. Also provided education on POC and Echo that is ordered. Pt expressed concern that it had not been completed yet. Educated that he was on the list and that the Echo tech would come around, however, the sickest patients are seen first. Pt expressed concern that he was not getting seen quickly enough. Provided education again. Daughter has multiple questions which are more appropriate for MD to answer. Provided education that Dr Lydia Sams would be around to speak with them on her rounds. Notified Dr Lydia Sams of the above. MD states she will see them on rounds.

## 2024-05-28 NOTE — Discharge Instructions (Addendum)
 Some PCP options in [place] area- not a comprehensive list  [facility]- [phone] [employer] [facility]- [phone] [facility]- [phone] [facility]- [phone] [facility]- [phone] [facility]- [phone]  or [facility] Physician Referral Line [phone]  Low Sodium Nutrition Therapy  Eating less sodium can help you if you have high blood pressure, heart failure, or kidney or liver disease.   Your body needs a little sodium, but too much sodium can cause your body to hold onto extra water. This extra water will raise your blood pressure and can cause damage to your heart, kidneys, or liver as they are forced to work harder.   Sometimes you can see how the extra fluid affects you because your hands, legs, or belly swell. You may also hold water around your heart and lungs, which makes it hard to breathe.   Even if you take medication for blood pressure or a water pill (diuretic) to remove fluid, it is still important to have less salt in your diet.   Check with your primary care provider before drinking alcohol since it may affect the amount of fluid in your body and how your heart, kidneys, or liver work. Sodium in Food A low-sodium meal plan limits the sodium that you get from food and beverages to 1,500-2,000 milligrams (mg) per day. Salt is the main source of sodium. Read the nutrition label on the package to find out how much sodium is in one serving of a food.  Select foods with 140 milligrams (mg) of sodium or less per serving.  You may be able to eat one or two servings of foods with a little more than 140 milligrams (mg) of sodium if you are closely watching how much sodium you eat in a day.  Check the serving size on the label. The amount of sodium listed on the label shows the amount in one serving of the food. So, if you eat more than one serving, you will get more sodium than the amount listed.  Tips Cutting Back on Sodium Eat more fresh  foods.  Fresh fruits and vegetables are low in sodium, as well as frozen vegetables and fruits that have no added juices or sauces.  Fresh meats are lower in sodium than processed meats, such as bacon, sausage, and hotdogs.  Not all processed foods are unhealthy, but some processed foods may have too much sodium.  Eat less salt at the table and when cooking. One of the ingredients in salt is sodium.  One teaspoon of table salt has 2,300 milligrams of sodium.  Leave the salt out of recipes for pasta, casseroles, and soups. Be a Engineer, building services.  Food packages that say "Salt-free", sodium-free", "very low sodium," and "low sodium" have less than 140 milligrams of sodium per serving.  Beware of products identified as "Unsalted," "No Salt Added," "Reduced Sodium," or "Lower Sodium." These items may still be high in sodium. You should always check the nutrition label. Add flavors to your food without adding sodium.  Try lemon juice, lime juice, or vinegar.  Dry or fresh herbs add flavor.  Buy a sodium-free seasoning blend or make your own at home. You can purchase salt-free or sodium-free condiments like barbeque sauce in stores and online. Ask your registered dietitian nutritionist for recommendations and where to find them.   Eating in Restaurants Choose foods carefully when you eat outside your home. Restaurant foods can be very high in sodium. Many restaurants provide nutrition facts on their menus or their websites. If  you cannot find that information, ask your server. Let your server know that you want your food to be cooked without salt and that you would like your salad dressing and sauces to be served on the side.    Foods Recommended Food Group Foods Recommended  Grains Bread, bagels, rolls without salted tops Homemade bread made with reduced-sodium baking powder Cold cereals, especially shredded wheat and puffed rice Oats, grits, or cream of wheat Pastas, quinoa, and rice Popcorn,  pretzels or crackers without salt Corn tortillas  Protein Foods Fresh meats and fish; [place] bacon (check the nutrition labels - make sure they are not packaged in a sodium solution) Canned or packed tuna (no more than 4 ounces at 1 serving) Beans and peas Soybeans) and tofu Eggs Nuts or nut butters without salt  Dairy Milk or milk powder Plant milks, such as rice and soy Yogurt, including Greek yogurt Small amounts of natural cheese (blocks of cheese) or reduced-sodium cheese can be used in moderation. (Swiss, ricotta, and fresh mozzarella cheese are lower in sodium than the others) Cream Cheese Low sodium cottage cheese  Vegetables Fresh and frozen vegetables without added sauces or salt Homemade soups (without salt) Low-sodium, salt-free or sodium-free canned vegetables and soups  Fruit Fresh and canned fruits Dried fruits, such as raisins, cranberries, and prunes  Oils Tub or liquid margarine, regular or without salt Canola, corn, peanut, olive, safflower, or sunflower oils  Condiments Fresh or dried herbs such as basil, bay leaf, dill, mustard (dry), nutmeg, paprika, parsley, rosemary, sage, or thyme.  Low sodium ketchup Vinegar  Lemon or lime juice Pepper, red pepper flakes, and cayenne. Hot sauce contains sodium, but if you use just a drop or two, it will not add up to much.  Salt-free or sodium-free seasoning mixes and marinades Simple salad dressings: vinegar and oil   Foods Not Recommended Food Group Foods Not Recommended  Grains Breads or crackers topped with salt Cereals (hot/cold) with more than 300 mg sodium per serving Biscuits, cornbread, and other "quick" breads prepared with baking soda Pre-packaged bread crumbs Seasoned and packaged rice and pasta mixes Self-rising flours  Protein Foods Cured meats: Bacon, ham, sausage, pepperoni and hot dogs Canned meats (chili, vienna sausage, or sardines) Smoked fish and meats Frozen meals that have more than 600 mg of  sodium per serving Egg substitute (with added sodium)  Dairy Buttermilk Processed cheese spreads Cottage cheese (1 cup may have over 500 mg of sodium; look for low-sodium.) American or feta cheese Shredded Cheese has more sodium than blocks of cheese String cheese  Vegetables Canned vegetables (unless they are salt-free, sodium-free or low sodium) Frozen vegetables with seasoning and sauces Sauerkraut and pickled vegetables Canned or dried soups (unless they are salt-free, sodium-free, or low sodium) [place] fries and onion rings  Fruit Dried fruits preserved with additives that have sodium  Oils Salted butter or margarine, all types of olives  Condiments Salt, sea salt, kosher salt, onion salt, and garlic salt Seasoning mixes with salt Bouillon cubes Ketchup Barbeque sauce and Worcestershire sauce unless low sodium Soy sauce Salsa, pickles, olives, relish Salad dressings: ranch, blue cheese, [place], and [place].   Low Sodium Sample 1-Day Menu  Breakfast 1 cup cooked oatmeal  1 slice whole wheat bread toast  1 tablespoon peanut butter without salt  1 banana  1 cup 1% milk  Lunch Tacos made with: 2 corn tortillas   cup black beans, low sodium   cup roasted or grilled chicken (without  skin)   avocado  Squeeze of lime juice  1 cup salad greens  1 tablespoon low-sodium salad dressing   cup strawberries  1 orange  Afternoon Snack 1/3 cup grapes  6 ounces yogurt  Evening Meal 3 ounces herb-baked fish  1 baked potato  2 teaspoons olive oil   cup cooked carrots  2 thick slices tomatoes on:  2 lettuce leaves  1 teaspoon olive oil  1 teaspoon balsamic vinegar  1 cup 1% milk  Evening Snack 1 apple   cup almonds without salt   Low-Sodium Vegetarian (Lacto-Ovo) Sample 1-Day Menu  Breakfast 1 cup cooked oatmeal  1 slice whole wheat toast  1 tablespoon peanut butter without salt  1 banana  1 cup 1% milk  Lunch Tacos made with: 2 corn tortillas   cup black beans, low  sodium   cup roasted or grilled chicken (without skin)   avocado  Squeeze of lime juice  1 cup salad greens  1 tablespoon low-sodium salad dressing   cup strawberries  1 orange  Evening Meal Stir fry made with:  cup tofu  1 cup brown rice   cup broccoli   cup green beans   cup peppers   tablespoon peanut oil  1 orange  1 cup 1% milk  Evening Snack 4 strips celery  2 tablespoons hummus  1 hard-boiled egg   Low-Sodium Vegan Sample 1-Day Menu  Breakfast 1 cup cooked oatmeal  1 tablespoon peanut butter without salt  1 cup blueberries  1 cup soymilk fortified with calcium, vitamin B12, and vitamin D  Lunch 1 small whole wheat pita   cup cooked lentils  2 tablespoons hummus  4 carrot sticks  1 medium apple  1 cup soymilk fortified with calcium, vitamin B12, and vitamin D  Evening Meal Stir fry made with:  cup tofu  1 cup brown rice   cup broccoli   cup green beans   cup peppers   tablespoon peanut oil  1 cup cantaloupe  Evening Snack 1 cup soy yogurt   cup mixed nuts  Copyright 2020  Academy of Nutrition and Dietetics. All rights reserved  Sodium Free Flavoring Tips  When cooking, the following items may be used for flavoring instead of salt or seasonings that contain sodium. Remember: A little bit of spice goes a long way! Be careful not to overseason. Spice Blend Recipe (makes about ? cup) 5 teaspoons onion powder  2 teaspoons garlic powder  2 teaspoons paprika  2 teaspoon dry mustard  1 teaspoon crushed thyme leaves   teaspoon white pepper   teaspoon celery seed Food Item Flavorings  Beef Basil, bay leaf, caraway, curry, dill, dry mustard, garlic, grape jelly, green pepper, mace, marjoram, mushrooms (fresh), nutmeg, onion or onion powder, parsley, pepper, rosemary, sage  Chicken Basil, cloves, cranberries, mace, mushrooms (fresh), nutmeg, oregano, paprika, parsley, pineapple, saffron, sage, savory, tarragon, thyme, tomato, turmeric  Egg  Chervil, curry, dill, dry mustard, garlic or garlic powder, green pepper, jelly, mushrooms (fresh), nutmeg, onion powder, paprika, parsley, rosemary, tarragon, tomato  Fish Basil, bay leaf, chervil, curry, dill, dry mustard, green pepper, lemon juice, marjoram, mushrooms (fresh), paprika, pepper, tarragon, tomato, turmeric  Lamb Cloves, curry, dill, garlic or garlic powder, mace, mint, mint jelly, onion, oregano, parsley, pineapple, rosemary, tarragon, thyme  Pork Applesauce, basil, caraway, chives, cloves, garlic or garlic powder, onion or onion powder, rosemary, thyme  Veal Apricots, basil, bay leaf, currant jelly, curry, ginger, marjoram, mushrooms (fresh), oregano, paprika  Vegetables  Basil, dill, garlic or garlic powder, ginger, lemon juice, mace, marjoram, nutmeg, onion or onion powder, tarragon, tomato, sugar or sugar substitute, salt-free salad dressing, vinegar  Desserts Allspice, anise, cinnamon, cloves, ginger, mace, nutmeg, vanilla extract, other extracts   Copyright 2020  Academy of Nutrition and Dietetics. All rights reserved  Fluid Restricted Nutrition Therapy  You have been prescribed this diet because your condition affects how much fluid you can eat or drink. If your heart, liver, or kidneys aren't working properly, you may not be able to effectively eliminate fluids from the body and this may cause swelling (edema) in the legs, arms, and/or stomach. Drink no more than _________ liters or ________ ounces or ________cups of fluid per day.  You don't need to stop eating or drinking the same fluids you normally would, but you may need to eat or drink less than usual.  Your registered dietitian nutritionist will help you determine the correct amount of fluid to consume during the day Breakfast Include fluids taken with medications  Lunch Include fluids taken with medications  Dinner Include fluids taken with medications  Bedtime Snack Include fluids taken with medications      Tips What Are Fluids?  A fluid is anything that is liquid or anything that would melt if left at room temperature. You will need to count these foods and liquids--including any liquid used to take medication--as part of your daily fluid intake. Some examples are: Alcohol (drink only with your doctor's permission)  Coffee, tea, and other hot beverages  Gelatin (Jell-O)  Gravy  Ice cream, sherbet, sorbet  Ice cubes, ice chips  Milk, liquid creamer  Nutritional supplements  Popsicles  Vegetable and fruit juices; fluid in canned fruit  Watermelon  Yogurt  Soft drinks, lemonade, limeade  Soups  Syrup How Do I Measure My Fluid Intake? Record your fluid intake daily.  Tip: Every day, each time you eat or drink fluids, pour water in the same amount into an empty container that can hold the same amount of fluids you are allowed daily. This may help you keep track of how much fluid you are taking in throughout the day.  To accurately keep track of how much liquid you take in, measure the size of the cups, glasses, and bowls you use. If you eat soup, measure how much of it is liquid and how much is solid (such as noodles, vegetables, meat). Conversions for Measuring Fluid Intake  Milliliters (mL) Liters (L) Ounces (oz) Cups (c)  1000 1 32 4  1200 1.2 40 5  1500 1.5 50 6 1/4  1800 1.8 60 7 1/2  2000 2 67 8 1/3  Tips to Reduce Your Thirst Chew gum or suck on hard candy.  Rinse or gargle with mouthwash. Do not swallow.  Ice chips or popsicles my help quench thirst, but this too needs to be calculated into the total restriction. Melt ice chips or cubes first to figure out how much fluid they produce (for example, experiment with melting  cup ice chips or 2 ice cubes).  Add a lemon wedge to your water.  Limit how much salt you take in. A high salt intake might make you thirstier.  Don't eat or drink all your allowed liquids at once. Space your liquids out through the day.  Use small  glasses and cups and sip slowly. If allowed, take your medications with fluids you eat or drink during a meal.   Fluid-Restricted Nutrition Therapy Sample 1-Day Menu  Breakfast 1 slice wheat toast  1 tablespoon peanut butter  1/2 cup yogurt (120 milliliters)  1/2 cup blueberries  1 cup milk (240 milliliters)   Lunch 3 ounces sliced [place]  2 slices whole wheat bread  1/2 cup lettuce for sandwich  2 slices tomato for sandwich  1 ounce reduced-fat, reduced-sodium cheese  1/2 cup fresh carrot sticks  1 banana  1 cup unsweetened tea (240 milliliters)   Evening Meal 8 ounces soup (240 milliliters)  3 ounces salmon  1/2 cup quinoa  1 cup green beans  1 cup mixed greens salad  1 tablespoon olive oil  1 cup coffee (240 milliliters)  Evening Snack 1/2 cup sliced peaches  1/2 cup frozen yogurt (120 milliliters)  1 cup water (240 milliliters)  Copyright 2020  Academy of Nutrition and Dietetics. All rights reserved   Potassium Content of Foods  You can eat healthy foods with potassium every day. Your body needs this mineral to work properly. It helps your nerves to function and muscles to contract and keeps your heartbeat regular. High levels of potassium in the blood can cause irregular heartbeats and can be life threatening.  A low blood level can cause severe muscle weakness and cramps. The key to selecting foods with potassium is having the right balance for your needs. Your registered dietitian nutritionist (RDN) can help create a daily plan that's right for you.  Tips Check portion sizes closely. For example, a serving of dried raisins is  cup and a serving of fresh grapes is  cup. The Nutrition Facts label presents the amount of potassium per serving. Products labeled "low salt" or "low sodium" may have more potassium since potassium is used as a salt substitute. Check the ingredient list for sources of potassium: Potassium chloride, potassium sorbate, tetrapotassium phosphate,  dipotassium phosphate.  Potassium in Foods A low-potassium food can become a high potassium food if you eat a large amount. A high-potassium food can be considered a low potassium food if you only eat a small amount of it.  Lower Potassium Choices Less than 200 milligrams per serving Higher Potassium Choices 200 milligrams or more per serving  Serving size Serving size of food is  cup, 1 small fruit, or 1 cup leafy green unless otherwise noted. Potassium levels may change if a food is fresh, cooked, or canned  Vegetables Asparagus Beans (green, wax, yellow) Bean sprouts Broccoli Cabbage (red or green) Carrots  Cauliflower Celery Corn Cucumbers Eggplant Greens (kale, mustard greens, endive, watercress) Peppers (green, red, orange) Kale Lettuce (all types) Leeks  Mushrooms Onions Peas (green, sugar snap, snow peas) Radishes (5) Summer squash Turnip Artichokes (1) Avocado Bamboo shoots (raw) Beets (canned, fresh) Brussels sprouts (fresh, frozen) Chard (cooked) Chinese cabbage/bok choy (cooked) Corn (1 ear) Kohlrabi Potatoes (white, sweet, yams) Parsnips Pumpkin Rutabaga Spinach (cooked, canned, frozen) Squash (winter, acorn, butternut, rhubarb) Tomatoes (raw, boiled, canned, purees, sauces) Vegetable or tomato juice    Fruits Apple, applesauce Apricot (1) Berries: blackberries blueberries cranberries, raspberries, strawberries Cherries Clementine (1), mandarin orange, tangerine Dried apples, blueberries, cherries, cranberries ( cup) Fruit cup: any fruit, fruit cocktail Grapes Grapefruit () Lemon and limes Plum (1) Pear Watermelon (1 cup) Fruit juice: apple, cranberry, grape, pineapple Nectars: apricot, mango, papaya, peach, pear Bananas Dried fruit (such as apricots, dates, figs, prunes, raisins, currants)  cup Kiwi (1) Melon: Cantaloupe, honeydew Nectarine (1 medium) Orange (fresh, 1 medium) Papaya (1/2 medium) Peach Plantain Pomegranate Fruit  juice: pomegranate, prune, orange, carrot  Grains Cereal, whole grain and plain (1cup)  Oatmeal (1 cup)  Bread, whole grain, raisin, white (1 slice) Pasta (whole grain and white) Rice (white or brown) Bran cereals Granola  Protein Foods Peanut butter (1 tablespoon)  Hummus ( cup)    Tofu (firm, soft, silken) Vegetarian patty (2 ounces, size of  palm of hand) Legumes/Pulses: Black, kidney, pinto or white beans, black-eyed peas, split peas, lentils, chickpeas, garbanzos Beef, fish, and poultry (3 ounces, size of palm of hand) Nuts ( cup) Soy: soy nuts , tofu (extra firm or lite)  Dairy and Alternatives Rice, almond, or oat dairy alternates Cream cheese (1 tablespoon) Natural cheese (blue, brie, cheddar, Swiss) (1 ounce) Sour cream (1 tablespoon) Milk (8 ounces)  Yogurt (fruit flavors, 8 ounces) Soy milk Cottage cheese (1 cup) Coconut milk  Beverages Lemonade (8 ounces) Tea (8 ounces, fresh brewed)   Coconut water Low-sodium broths and soups (1 cup,  can,1 packet, bouillon cube) Bottled/instant tea   Copyright 2025  Academy of Nutrition and Dietetics. All rights reserved

## 2024-05-28 NOTE — Plan of Care (Signed)

## 2024-05-28 NOTE — Progress Notes (Signed)
 Was able to bring paperwork to pt-educated on paperwork and process to fill it out-pt denied wanting to complete it at this time-encouraged pt to reach out to chaplain services if and when he decided to complete the paperwork

## 2024-05-28 NOTE — Progress Notes (Signed)
 Rounding Note   Patient Name: Victor Willis Date of Encounter: 05/28/2024  [facility] Cardiologist: [facility]- Dr. Nolan Battle   Subjective Resting comfortably in bed, reports continued shortness of breath perhaps with mild improvement on IV Lasix Compression hose in place, improvement of leg swelling Family at the bedside Denies chest pain concerning for angina  Scheduled Meds:  aspirin EC  81 mg Oral QODAY   [START ON 05/29/2024] furosemide  20 mg Intravenous Daily   heparin   5,000 Units Subcutaneous Q8H   metoprolol  succinate  25 mg Oral Daily   nicotine   21 mg Transdermal Daily   oxycodone   30 mg Oral 5 X Daily   patiromer  25.2 g Oral Daily   polyethylene glycol  17 g Oral Daily   sulfaSALAzine   500 mg Oral BID   Continuous Infusions:  PRN Meds: albuterol   Vital Signs  Vitals:   05/28/24 0549 05/28/24 0610 05/28/24 0821 05/28/24 1252  BP: (!) 141/93  128/89 (!) 108/94  Pulse: (!) 117  99 85  Resp: 18     Temp: 97.7 F (36.5 C)  (!) 97.1 F (36.2 C) (!) 97 F (36.1 C)  TempSrc:      SpO2: 100%  99% 97%  Weight:  57.9 kg    Height:        Intake/Output Summary (Last 24 hours) at 05/28/2024 1303 Last data filed at 05/28/2024 1025 Gross per 24 hour  Intake 360 ml  Output 300 ml  Net 60 ml      05/28/2024    6:10 AM 05/27/2024    6:01 AM 05/26/2024    8:23 PM  Last 3 Weights  Weight (lbs) 127 lb 11.2 oz 131 lb 12.8 oz 131 lb 12.8 oz  Weight (kg) 57.924 kg 59.784 kg 59.784 kg      Telemetry Normal sinus rhythm- Personally Reviewed  ECG   - Personally Reviewed  Physical Exam  GEN: No acute distress.  Significant kyphosis, thin, pale Neck:  JVD 8+ Cardiac: RRR, no murmurs, rubs, or gallops.  Respiratory: Clear to auscultation bilaterally. GI: Soft, nontender, non-distended  MS: No edema; No deformity. Neuro:  Nonfocal  Psych: Normal affect   Labs High Sensitivity Troponin:   Recent Labs  Lab 05/26/24 1414 05/26/24 1624 05/26/24 2059  05/26/24 2128  TROPONINIHS 59* 57* 68* 69*     Chemistry Recent Labs  Lab 05/26/24 1414 05/26/24 2059 05/27/24 0532 05/28/24 0601  NA 139  --  138 138  K 5.3*  --  5.6* 6.0*  CL 110  --  110 109  CO2 18*  --  20* 20*  GLUCOSE 171*  --  123* 129*  BUN 51*  --  60* 58*  CREATININE 2.66* 2.78* 2.91* 2.71*  CALCIUM 8.1*  --  8.2* 8.5*  PROT  --   --   --  6.4*  ALBUMIN  --   --   --  2.9*  AST  --   --   --  22  ALT  --   --   --  11  ALKPHOS  --   --   --  63  BILITOT  --   --   --  0.5  GFRNONAA 26* 24* 23* 25*  ANIONGAP 11  --  8 9    Lipids No results for input(s): CHOL, TRIG, HDL, LABVLDL, LDLCALC, CHOLHDL in the last 168 hours.  Hematology Recent Labs  Lab 05/26/24 1414 05/26/24 2059 05/27/24 0532  WBC 8.0 10.0 8.1  RBC 3.38* 3.10* 3.27*  HGB 10.8* 9.9* 10.7*  HCT 34.1* 31.3* 32.9*  MCV 100.9* 101.0* 100.6*  MCH 32.0 31.9 32.7  MCHC 31.7 31.6 32.5  RDW 14.0 14.0 14.1  PLT 291 273 284   Thyroid No results for input(s): TSH, FREET4 in the last 168 hours.  BNP Recent Labs  Lab 05/26/24 1414  BNP 3,571.6*    DDimer No results for input(s): DDIMER in the last 168 hours.   Radiology  DG Chest 2 View Result Date: 05/26/2024 CLINICAL DATA:  Shortness of breath EXAM: CHEST - 2 VIEW COMPARISON:  None Available. FINDINGS: Underinflation. Small bilateral pleural effusions are seen, right-greater-than-left adjacent opacities. Enlarged cardiopericardial silhouette. Tortuous ectatic aorta. Interstitial prominence noted. No pneumothorax. Frontal view is rotated to the right. Osteopenia. Diffuse bridging syndesmophytes. Air-fluid level along the stomach beneath the left hemidiaphragm. IMPRESSION: Hyperinflation. Pleural effusions, right-greater-than-left. Enlarged heart. Electronically Signed   By: Adrianna Horde M.D.   On: 05/26/2024 14:51    Cardiac Studies Echo with severely reduced ejection fraction less than 20%  Patient Profile   Victor Gatlin  Willis is a 68 y.o. 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%  Assessment & Plan  Acute systolic CHF As outpatient with worsening shortness of breath, edema, acute on chronic renal failure Volume overload on arrival with leg swelling, abdominal distention -Echocardiogram detailing severely reduced LV function less than 20% dilated LV - Etiology of his cardiomyopathy unclear, troponin minimally elevated nontrending, prior history alcohol - Given worsening renal function not a great candidate for cardiac catheterization - ACE/ARB on hold in the setting of renal failure, hyperkalemia - Would continue IV Lasix twice daily, Will discuss with advanced heart failure team, options may be limited  Coronary artery calcification, elevated troponin - Severely reduced LV function, denies anginal symptoms - Long history of smoking, hyperlipidemia - Not a good candidate at this time for ischemic workup given underlying renal dysfunction  Hyperkalemia Nephrology following, receiving Lokelma ACE/Arni/ARB on hold  Acute on chronic kidney disease stage IV Possibly exacerbated by low cardiac output Currently on IV Lasix  Alcohol and smoking abuse Cessation recommended  For questions or updates, please contact [facility] Please consult [email or web address] for contact info under     Signed, Django Nguyen, MD  05/28/2024, 1:03 PM

## 2024-05-28 NOTE — Plan of Care (Signed)
 Nutrition Education Note  RD consulted for nutrition education regarding new onset CHF.  Case discussed with MD and RN; pt currently on a regular diet with 1.5 L fluid restriction. RD wanted to clarify orders to avoid confusion as many providers had addressed diet with pt. MD requesting renal diet secondary to high K.   Spoke with pt, fiance, and grandson at bedside. Pt reports decreased oral intake for 1 week, but usually has a good appetite. Pt reports appetite has returned. Pt enjoys cooking and makes a lot of his foods at home. He consumes 3 meals per day PTA (Breakfast: cereal or eggs; Lunch: sandwich; Dinner: meat, potato, and steamed vegetable). Pt usually seasons with pepper, salt, and garlic. RD spent most of the visit discussing how to decrease added sodium in diet.   RD provided Low Sodium Nutrition Therapy and Potassium Content of Foods handout from the Academy of [employer]etics. Reviewed patient's dietary recall. Provided examples on ways to decrease sodium intake in diet. Discouraged intake of processed foods and use of salt shaker. Encouraged fresh fruits and vegetables as well as whole grain sources of carbohydrates to maximize fiber intake.   RD reviewed sources of potassium in foods.  RD discussed why it is important for patient to adhere to diet recommendations, and emphasized the role of fluids, foods to avoid, and importance of weighing self daily. Teach back method used.  Expect fair compliance.  Current diet order is renal diet with 1.2 L fluid restriction, patient is consuming approximately 100% of meals at this time. Labs and medications reviewed. No further nutrition interventions warranted at this time. RD contact information provided. If additional nutrition issues arise, please re-consult RD.   Herschel Lords, RD, LDN, CDCES Registered Dietitian III Certified Diabetes Care and Education Specialist If unable to reach this RD, please use RD Inpatient group  chat on secure chat between hours of 8am-4 pm daily

## 2024-05-29 DIAGNOSIS — I420 Dilated cardiomyopathy: Secondary | ICD-10-CM | POA: Diagnosis not present

## 2024-05-29 DIAGNOSIS — I5021 Acute systolic (congestive) heart failure: Secondary | ICD-10-CM | POA: Diagnosis not present

## 2024-05-29 DIAGNOSIS — E8770 Fluid overload, unspecified: Secondary | ICD-10-CM | POA: Diagnosis not present

## 2024-05-29 DIAGNOSIS — I10 Essential (primary) hypertension: Secondary | ICD-10-CM | POA: Diagnosis not present

## 2024-05-29 DIAGNOSIS — I428 Other cardiomyopathies: Secondary | ICD-10-CM

## 2024-05-29 LAB — RENAL FUNCTION PANEL
Albumin: 2.6 g/dL — ABNORMAL LOW (ref 3.5–5.0)
Anion gap: 10 (ref 5–15)
BUN: 56 mg/dL — ABNORMAL HIGH (ref 8–23)
CO2: 18 mmol/L — ABNORMAL LOW (ref 22–32)
Calcium: 8.4 mg/dL — ABNORMAL LOW (ref 8.9–10.3)
Chloride: 108 mmol/L (ref 98–111)
Creatinine, Ser: 2.41 mg/dL — ABNORMAL HIGH (ref 0.61–1.24)
GFR, Estimated: 29 mL/min — ABNORMAL LOW (ref >60)
Glucose, Bld: 122 mg/dL — ABNORMAL HIGH (ref 70–99)
Phosphorus: 5.7 mg/dL — ABNORMAL HIGH (ref 2.5–4.6)
Potassium: 4.2 mmol/L (ref 3.5–5.1)
Sodium: 136 mmol/L (ref 135–145)

## 2024-05-29 LAB — MAGNESIUM: Magnesium: 1.9 mg/dL (ref 1.7–2.4)

## 2024-05-29 LAB — LACTIC ACID, PLASMA: Lactic Acid, Venous: 0.8 mmol/L (ref 0.5–1.9)

## 2024-05-29 MED ORDER — SODIUM CHLORIDE 0.9 % IV SOLN: INTRAVENOUS | Status: DC

## 2024-05-29 MED ORDER — TRAZODONE HCL 50 MG PO TABS
25.0000 mg | ORAL_TABLET | Freq: Once | ORAL | Status: DC
  Filled 2024-05-29: qty 1

## 2024-05-29 MED ORDER — PATIROMER SORBITEX CALCIUM 8.4 G PO PACK
16.8000 g | PACK | Freq: Every day | ORAL | Status: DC
  Administered 2024-05-29: 16.8 g via ORAL
  Filled 2024-05-29 (×3): qty 2

## 2024-05-29 NOTE — Progress Notes (Signed)
 Heart Failure Navigator Progress Note  Assessed for Heart & Vascular TOC clinic readiness.  Patient does not meet criteria due to current Advanced Heart Failure Team patient of Bevelyn Buckles. Bensimhon, MD.   Navigator will sign off at this time.  Roxy Horseman, RN, BSN [facility] Heart Failure Navigator Secure Chat Only

## 2024-05-29 NOTE — Progress Notes (Signed)
 [facility]  - [place] [facility]   PATIENT NAME: Victor Willis    MR#:  [id number]  DATE OF BIRTH:  25-Oct-1956  SUBJECTIVE:  patient's daughter, wife and son at bedside. Patient urinating well. Sitting at the edge of the bed. Denies respiratory distress sats more than 92% on room air. Leg swelling improving.  VITALS:  Blood pressure 133/87, pulse 90, temperature 97.6 F (36.4 C), resp. rate 19, height 5' (1.524 m), weight 56.7 kg, SpO2 100%.  PHYSICAL EXAMINATION:   GENERAL:  68 y.o.-year-old patient with no acute distress.  LUNGS: decreased breath sounds bilaterally, no wheezing CARDIOVASCULAR: S1, S2 normal. No murmur   ABDOMEN: Soft, nontender, nondistended. Bowel sounds present.  EXTREMITIES: ++edema b/l.   Teds+ NEUROLOGIC: nonfocal  patient is alert and awake   LABORATORY PANEL:  CBC Recent Labs  Lab 05/27/24 0532  WBC 8.1  HGB 10.7*  HCT 32.9*  PLT 284    Chemistries  Recent Labs  Lab 05/28/24 0601 05/28/24 1438 05/29/24 0311  NA 138  --  136  K 6.0*   < > 4.2  CL 109  --  108  CO2 20*  --  18*  GLUCOSE 129*  --  122*  BUN 58*  --  56*  CREATININE 2.71*  --  2.41*  CALCIUM 8.5*  --  8.4*  MG  --   --  1.9  AST 22  --   --   ALT 11  --   --   ALKPHOS 63  --   --   BILITOT 0.5  --   --    < > = values in this interval not displayed.   Cardiac Enzymes No results for input(s): TROPONINI in the last 168 hours. RADIOLOGY:  ECHOCARDIOGRAM COMPLETE Result Date: 05/28/2024    ECHOCARDIOGRAM REPORT   Patient Name:   Victor Willis Date of Exam: 05/28/2024 Medical Rec #:  [id number]           Height:       60.0 in Accession #:    [id number]          Weight:       127.7 lb Date of Birth:  Feb 19, 1956           BSA:          1.543 m Patient Age:    68 years            BP:           128/89 mmHg Patient Gender: M                   HR:           99 bpm. Exam Location:  [facility] Procedure: 2D Echo, Cardiac Doppler, Color Doppler, Strain Analysis  and 3D Echo            (Both Spectral and Color Flow Doppler were utilized during            procedure). Indications:     CHF-acute diastolic I50.31  History:         Patient has no prior history of Echocardiogram examinations.                  Risk Factors:Hypertension. CKD.  Sonographer:     Broadus Canes Referring Phys:  [id number] Jamilia Jacques Diagnosing Phys: Belva Boyden MD  Sonographer Comments: Global longitudinal strain was attempted. IMPRESSIONS  1. Left ventricular ejection fraction, by estimation, is <  20%. Left ventricular ejection fraction by 3D volume is 11 %. Left ventricular ejection fraction by PLAX is 12 %. The left ventricle has severely decreased function. The left ventricle demonstrates global hypokinesis. The left ventricular internal cavity size was moderately dilated. Left ventricular diastolic parameters are consistent with Grade II diastolic dysfunction (pseudonormalization).  2. Right ventricular systolic function is normal. The right ventricular size is normal. There is moderately elevated pulmonary artery systolic pressure. The estimated right ventricular systolic pressure is 47.8 mmHg.  3. Left atrial size was moderately dilated.  4. The mitral valve is normal in structure. Moderate mitral valve regurgitation. No evidence of mitral stenosis.  5. Tricuspid valve regurgitation is mild to moderate.  6. The aortic valve is tricuspid. Aortic valve regurgitation is not visualized. No aortic stenosis is present.  7. The inferior vena cava is normal in size with greater than 50% respiratory variability, suggesting right atrial pressure of 3 mmHg. FINDINGS  Left Ventricle: Left ventricular ejection fraction, by estimation, is <20%. Left ventricular ejection fraction by PLAX is 12 %. Left ventricular ejection fraction by 3D volume is 11 %. The left ventricle has severely decreased function. The left ventricle demonstrates global hypokinesis. The left ventricular internal cavity size was moderately  dilated. There is no left ventricular hypertrophy. Left ventricular diastolic parameters are consistent with Grade II diastolic dysfunction (pseudonormalization). Right Ventricle: The right ventricular size is normal. No increase in right ventricular wall thickness. Right ventricular systolic function is normal. There is moderately elevated pulmonary artery systolic pressure. The tricuspid regurgitant velocity is 3.27 m/s, and with an assumed right atrial pressure of 5 mmHg, the estimated right ventricular systolic pressure is 47.8 mmHg. Left Atrium: Left atrial size was moderately dilated. Right Atrium: Right atrial size was normal in size. Pericardium: There is no evidence of pericardial effusion. Mitral Valve: The mitral valve is normal in structure. Moderate mitral valve regurgitation. No evidence of mitral valve stenosis. Tricuspid Valve: The tricuspid valve is normal in structure. Tricuspid valve regurgitation is mild to moderate. No evidence of tricuspid stenosis. Aortic Valve: The aortic valve is tricuspid. Aortic valve regurgitation is not visualized. No aortic stenosis is present. Aortic valve mean gradient measures 2.0 mmHg. Aortic valve peak gradient measures 4.5 mmHg. Aortic valve area, by VTI measures 1.59 cm. Pulmonic Valve: The pulmonic valve was normal in structure. Pulmonic valve regurgitation is not visualized. No evidence of pulmonic stenosis. Aorta: The aortic root is normal in size and structure. Venous: The inferior vena cava is normal in size with greater than 50% respiratory variability, suggesting right atrial pressure of 3 mmHg. IAS/Shunts: No atrial level shunt detected by color flow Doppler. Additional Comments: 3D was performed not requiring image post processing on an independent workstation and was abnormal.  LEFT VENTRICLE PLAX 2D LV EF:         Left            Diastology                ventricular     LV e' medial:    3.15 cm/s                ejection        LV E/e' medial:  30.6                 fraction by     LV e' lateral:   7.29 cm/s  PLAX is 12      LV E/e' lateral: 13.2                %. LVIDd:         5.70 cm         2D Longitudinal LVIDs:         5.40 cm         Strain LV PW:         1.00 cm         2D Strain GLS   -3.5 % LV IVS:        1.70 cm         Avg: LVOT diam:     2.30 cm LV SV:         24              3D Volume EF LV SV Index:   16              LV 3D EF:    Left LVOT Area:     4.15 cm                     ventricul                                             ar                                             ejection LV Volumes (MOD)                            fraction LV vol d, MOD    252.0 ml                   by 3D A4C:                                        volume is LV vol s, MOD    230.0 ml                   11 %. A4C: LV SV MOD A4C:   252.0 ml                                3D Volume EF:                                3D EF:        11 % RIGHT VENTRICLE RV Basal diam:  3.40 cm RV Mid diam:    2.50 cm RV S prime:     11.40 cm/s TAPSE (M-mode): 1.5 cm LEFT ATRIUM              Index        RIGHT ATRIUM           Index LA diam:        5.10 cm  3.31 cm/m   RA Area:     19.10  cm LA Vol (A2C):   101.0 ml 65.48 ml/m  RA Volume:   50.70 ml  32.87 ml/m LA Vol (A4C):   74.0 ml  47.97 ml/m LA Biplane Vol: 92.1 ml  59.71 ml/m  AORTIC VALVE AV Area (Vmax):    1.63 cm AV Area (Vmean):   1.52 cm AV Area (VTI):     1.59 cm AV Vmax:           105.50 cm/s AV Vmean:          70.700 cm/s AV VTI:            0.152 m AV Peak Grad:      4.5 mmHg AV Mean Grad:      2.0 mmHg LVOT Vmax:         41.50 cm/s LVOT Vmean:        25.800 cm/s LVOT VTI:          0.058 m LVOT/AV VTI ratio: 0.38  AORTA Ao Root diam: 2.70 cm MITRAL VALVE                  TRICUSPID VALVE MV Area (PHT): 6.37 cm       TR Peak grad:   42.8 mmHg MV Decel Time: 119 msec       TR Vmax:        327.00 cm/s MR Peak grad:    83.9 mmHg MR Mean grad:    53.0 mmHg    SHUNTS MR Vmax:         458.00 cm/s  Systemic VTI:   0.06 m MR Vmean:        340.0 cm/s   Systemic Diam: 2.30 cm MR PISA:         3.08 cm MR PISA Eff ROA: 24 mm MR PISA Radius:  0.70 cm MV E velocity: 96.40 cm/s MV A velocity: 58.70 cm/s MV E/A ratio:  1.64 Belva Boyden MD Electronically signed by Belva Boyden MD Signature Date/Time: 05/28/2024/1:08:47 PM    Final    Assessment and Plan   Kerolos Nehme is a 68 y.o. male with a hx of coronary artery calcification noted on CT imaging, CKD stage IV, DM2, aortic atherosclerosis, HTN, prior heavy alcohol use with history of alcoholic pancreatitis, ongoing tobacco use, ankylosing spondylitis, macrocytic anemia, diverticulosis, and prior back and hip surgery came in after he was asked by orthopedic surgery to come to the emergency room for potassium 6.4.  Hyperkalemia acute on chronic kidney disease stage IV/type II diabetes/hypertension -- patient's potassium is down to 5.6 -- nephrology consultation with Dr. Christain Courser on [facility] -- receiving IV Lasix monitor input output and metabolic panel --?Cardiorenal syndrome --K 6.0--d/w Dr Lateef--recommends Veltassa bid- --renal diet --k down to 4.2--change to every day dosing of veltassa --creat down to 2.7  Volume overload/fluid overload/acute systolic congestive heart failure severe cardiomyopathy with EF of 20% -- IV Lasix BID monitor input output creatinine--increased dose to 80 mg bid -- cardiology consultation with [facility] cardiology -- Echo shows EF of less than 20%. Global dyskinesia -- daily weights -- advance heart failure team to see patient--appreciate input. --plans for RHC for tomorrow  History of atrial tachycardia -- continue metoprolol   History of smoking and alcohol use -- recommend cessation  Elevated troponin appears demand ischemia in the setting of CKD stage IV -- no chest pain -- continue aspirin    Procedures: Family communication : daughter, wife Consults : [facility] cardiology, nephrology CODE STATUS:  full DVT  Prophylaxis : heparin  Level of care: Telemetry Cardiac Status is: Inpatient Remains inpatient appropriate because: CHF, acute on chronic CKD    TOTAL TIME TAKING CARE OF THIS PATIENT: 45 minutes.  >50% time spent on counselling and coordination of care  Note: This dictation was prepared with Dragon dictation along with smaller phrase technology. Any transcriptional errors that result from this process are unintentional.  Melvinia Stager M.D    [facility]   CC: Primary care physician; Pcp, No

## 2024-05-29 NOTE — Progress Notes (Signed)
 At 9pm tonight, RN messaged provider for medication to help patient sleep tonight. Patient was anxious about procedure tomorrow and requesting medication.  Provider ordered trazodone for patient. RN walks into room with medication, patient states  dont worry about it, I just took some xanax.   RN told patient not to do that again and where he got the medication. Patient states he received it from significant other who is not present at this time. RN is returning trazodone back to pyxis. Will continue to monitor.

## 2024-05-29 NOTE — Progress Notes (Addendum)
 Rounding Note   Patient Name: Victor Willis Date of Encounter: 05/29/2024  [facility] Cardiologist: None  New consult done by Dr End  Subjective Patient seen on rounds.  Denies any chest pain or shortness of breath.  States that he has been voiding well.  Had noticed reduced swelling to his bilateral lower extremities.  -1.2 L output in the last 24 hours.  Tentatively scheduled for right heart catheterization tomorrow.  Family remains in the room at the bedside.  Scheduled Meds:  aspirin EC  81 mg Oral QODAY   furosemide  80 mg Intravenous BID   heparin   5,000 Units Subcutaneous Q8H   nicotine   21 mg Transdermal Daily   oxycodone   30 mg Oral 5 X Daily   patiromer  16.8 g Oral Daily   polyethylene glycol  17 g Oral Daily   sulfaSALAzine   500 mg Oral BID   Continuous Infusions:  [START ON 05/30/2024] sodium chloride      PRN Meds: albuterol   Vital Signs  Vitals:   05/29/24 0436 05/29/24 0500 05/29/24 0858 05/29/24 1227  BP: (!) 143/90  130/88 133/87  Pulse: 93  93 90  Resp: 19     Temp: 97.6 F (36.4 C)  97.8 F (36.6 C) 97.6 F (36.4 C)  TempSrc: Oral     SpO2: 98%  100% 100%  Weight:  56.7 kg    Height:        Intake/Output Summary (Last 24 hours) at 05/29/2024 1557 Last data filed at 05/29/2024 1511 Gross per 24 hour  Intake 480 ml  Output 1700 ml  Net -1220 ml      05/29/2024    5:00 AM 05/28/2024    6:10 AM 05/27/2024    6:01 AM  Last 3 Weights  Weight (lbs) 125 lb 127 lb 11.2 oz 131 lb 12.8 oz  Weight (kg) 56.7 kg 57.924 kg 59.784 kg      Telemetry Sinus rhythm with unifocal PVCs occasional rates of 80-90- Personally Reviewed  ECG  No new tracings- Personally Reviewed  Physical Exam  GEN: Chronically ill-appearing in no acute distress Neck: + JVD Cardiac: RRR, no murmurs, rubs, or gallops.  Respiratory: Diminished to auscultation bilaterally.  Respirations are unlabored at rest on room air GI: Soft, nontender, mildly distended   MS: 2+ edema BLE; No deformity.  Compression stockings on bilaterally Neuro:  Nonfocal  Psych: Normal affect   Labs High Sensitivity Troponin:   Recent Labs  Lab 05/26/24 1414 05/26/24 1624 05/26/24 2059 05/26/24 2128  TROPONINIHS 59* 57* 68* 69*     Chemistry Recent Labs  Lab 05/27/24 0532 05/28/24 0601 05/28/24 1438 05/29/24 0311  NA 138 138  --  136  K 5.6* 6.0* 5.0 4.2  CL 110 109  --  108  CO2 20* 20*  --  18*  GLUCOSE 123* 129*  --  122*  BUN 60* 58*  --  56*  CREATININE 2.91* 2.71*  --  2.41*  CALCIUM 8.2* 8.5*  --  8.4*  MG  --   --   --  1.9  PROT  --  6.4*  --   --   ALBUMIN  --  2.9*  --  2.6*  AST  --  22  --   --   ALT  --  11  --   --   ALKPHOS  --  63  --   --   BILITOT  --  0.5  --   --  GFRNONAA 23* 25*  --  29*  ANIONGAP 8 9  --  10    Lipids No results for input(s): CHOL, TRIG, HDL, LABVLDL, LDLCALC, CHOLHDL in the last 168 hours.  Hematology Recent Labs  Lab 05/26/24 1414 05/26/24 2059 05/27/24 0532  WBC 8.0 10.0 8.1  RBC 3.38* 3.10* 3.27*  HGB 10.8* 9.9* 10.7*  HCT 34.1* 31.3* 32.9*  MCV 100.9* 101.0* 100.6*  MCH 32.0 31.9 32.7  MCHC 31.7 31.6 32.5  RDW 14.0 14.0 14.1  PLT 291 273 284   Thyroid No results for input(s): TSH, FREET4 in the last 168 hours.  BNP Recent Labs  Lab 05/26/24 1414  BNP 3,571.6*    DDimer No results for input(s): DDIMER in the last 168 hours.   Radiology    Cardiac Studies 2D echo 05/28/2024 1. Left ventricular ejection fraction, by estimation, is <20%. Left  ventricular ejection fraction by 3D volume is 11 %. Left ventricular  ejection fraction by PLAX is 12 %. The left ventricle has severely  decreased function. The left ventricle  demonstrates global hypokinesis. The left ventricular internal cavity size  was moderately dilated. Left ventricular diastolic parameters are  consistent with Grade II diastolic dysfunction (pseudonormalization).   2. Right ventricular systolic  function is normal. The right ventricular  size is normal. There is moderately elevated pulmonary artery systolic  pressure. The estimated right ventricular systolic pressure is 47.8 mmHg.   3. Left atrial size was moderately dilated.   4. The mitral valve is normal in structure. Moderate mitral valve  regurgitation. No evidence of mitral stenosis.   5. Tricuspid valve regurgitation is mild to moderate.   6. The aortic valve is tricuspid. Aortic valve regurgitation is not  visualized. No aortic stenosis is present.   7. The inferior vena cava is normal in size with greater than 50%  respiratory variability, suggesting right atrial pressure of 3 mmHg.   Patient Profile   68 y.o. male with past medical history of coronary artery calcification noted on CT imaging, CKD stage IV, type 2 diabetes, aortic atherosclerosis, hypertension, prior heavy alcohol use with a history of alcoholic pancreatitis, ongoing tobacco use, ankylosing spondylitis, microcytic anemia, diverticulosis, and prior back and hip surgery (in the remote past) who is being seen and evaluated for acute systolic congestive heart failure.  Assessment & Plan  Acute systolic heart failure -Echocardiogram revealed LVEF less than 20% RV mild to moderately reduced -NYHA class IV symptoms --1.2 L output in the last 24 hours -Not a candidate for coronary angiogram.  With AKI on CKD -Not a candidate for advanced therapies due to severe lung and kidney disease -Currently maintained on furosemide 80 mg IV twice daily  -Beta-blocker therapy continues to be -Scheduled for a right heart catheterization tomorrow to obtain hemodynamics to better understand fluid overload -Continues to be followed by advanced heart failure -Heart failure education -Daily weights and I's and O's  AKI on CKD IV -Baseline 2.0-2.2 -Peaked at 2.91 down to 2.41 this a.m. -Suspect component of cardiorenal with improvement with increase furosemide -Monitor urine  output -Daily BMP - Nephrology following  Coronary artery calcification with elevated high-sensitivity troponin - Currently without symptoms of angina -High-sensitivity troponins trended flat likely in the setting of acute systolic heart failure and progressive renal decline with underlying macrocytic anemia -Not consistent with ACS -Not currently a candidate for cardiac catheterization or coronary CTA given advanced renal dysfunction -Continue aspirin 81 mg daily -Previously reported fatigue with atorvastatin we will look  to rechallenge with rosuvastatin down the road  COPD with ongoing tobacco abuse -Continued on nicotine  patch -Total cessation is recommended -Ongoing management per IM  EtOH abuse -He has significantly decreased alcohol use with complete cessation of alcohol recommended  Hyperkalemia -Serum potassium is improved to 4.2 had peaked at 6 -Daily BMP - Continued on Veltassa -Avoid ACE/ARB/Arni/MRA - Ongoing management per IM and nephrology  Ankylosing spondylitis -Stable   Informed Consent   Shared Decision Making/Informed Consent The risks, including but not limited to, [bleeding or vascular complications (1 in 500), pneumothorax (1 in 1600), arrhythmia (1 in 1000) and death (1 in 5000)], benefits (diagnostic support and/or management of heart failure, pulmonary hypertension) and alternatives of a right heart catheterization were discussed in detail with Mr. Brindisi and he is willing to proceed.     For questions or updates, please contact [facility] Please consult [email or web address] for contact info under     Signed, Arye Weyenberg, NP  05/29/2024, 3:57 PM

## 2024-05-29 NOTE — Plan of Care (Signed)

## 2024-05-29 NOTE — Progress Notes (Signed)
 [facility] [place], [place] 05/29/24  Subjective:   Hospital day # 3  Patient known to our practice from outpatient follow-up for CKD.  He was last seen in March 2024 by Dr. Rhesa Celeste.  He has diabetic nephropathy, hypertension and chronic hyperkalemia.  Patient was lost to follow-up.  He presented to the emergency room on Monday for abnormal labs specifically high potassium.  Upon first presentation his potassium was found to be 5.3.  Outpatient potassium on June 11 was 6.4. His baseline creatinine appears to be between 2.0-2.3 with GFR ranging from 30-36.  Admission creatinine is 2.66 which has worsened to 2.91 today with corresponding GFR of 29.  Potassium remains elevated at 5.6. Patient also has mild anemia with hemoglobin of 10.7. Multiple family members are at bedside today when seen.  Update:  Good urine output 1.5 L over the preceding 24 hours. Creatinine down to 2.4. Potassium also normalized to 4.2.  06/18 0701 - 06/19 0700 In: 120 [P.O.:120] Out: 1550 [Urine:1550] Lab Results  Component Value Date   CREATININE 2.41 (H) 05/29/2024   CREATININE 2.71 (H) 05/28/2024   CREATININE 2.91 (H) 05/27/2024        Objective:  Vital signs in last 24 hours:  Temp:  [97.5 F (36.4 C)-97.8 F (36.6 C)] 97.6 F (36.4 C) (06/19 1227) Pulse Rate:  [90-94] 90 (06/19 1227) Resp:  [18-20] 19 (06/19 0436) BP: (130-145)/(87-92) 133/87 (06/19 1227) SpO2:  [97 %-100 %] 100 % (06/19 1227) Weight:  [56.7 kg] 56.7 kg (06/19 0500)  Weight change: -1.225 kg Filed Weights   05/27/24 0601 05/28/24 0610 05/29/24 0500  Weight: 59.8 kg 57.9 kg 56.7 kg    Intake/Output:    Intake/Output Summary (Last 24 hours) at 05/29/2024 1651 Last data filed at 05/29/2024 1511 Gross per 24 hour  Intake 480 ml  Output 1700 ml  Net -1220 ml     Physical Exam: General: No acute distress  HEENT Moist oral mucous membranes, anicteric  Pulm/lungs Normal breathing effort on room  air, clear to auscultation  CVS/Heart S1S2 no rubs  Abdomen:  Soft, nontender  Extremities: 1+ peripheral edema bilaterally  Neurologic: Alert and oriented  Skin: No acute rashes          Basic Metabolic Panel:  Recent Labs  Lab 05/26/24 1414 05/26/24 2059 05/27/24 0532 05/28/24 0601 05/28/24 1438 05/29/24 0311  NA 139  --  138 138  --  136  K 5.3*  --  5.6* 6.0* 5.0 4.2  CL 110  --  110 109  --  108  CO2 18*  --  20* 20*  --  18*  GLUCOSE 171*  --  123* 129*  --  122*  BUN 51*  --  60* 58*  --  56*  CREATININE 2.66* 2.78* 2.91* 2.71*  --  2.41*  CALCIUM 8.1*  --  8.2* 8.5*  --  8.4*  MG  --   --   --   --   --  1.9  PHOS  --   --   --   --   --  5.7*     CBC: Recent Labs  Lab 05/26/24 1414 05/26/24 2059 05/27/24 0532  WBC 8.0 10.0 8.1  HGB 10.8* 9.9* 10.7*  HCT 34.1* 31.3* 32.9*  MCV 100.9* 101.0* 100.6*  PLT 291 273 284     No results found for: HEPBSAG, HEPBSAB, HEPBIGM    Microbiology:  No results found for this or any previous visit (  from the past 240 hours).  Coagulation Studies: Recent Labs    05/27/24 0532  LABPROT 15.4*  INR 1.2    Urinalysis: No results for input(s): COLORURINE, LABSPEC, PHURINE, GLUCOSEU, HGBUR, BILIRUBINUR, KETONESUR, PROTEINUR, UROBILINOGEN, NITRITE, LEUKOCYTESUR in the last 72 hours.  Invalid input(s): APPERANCEUR    Imaging: ECHOCARDIOGRAM COMPLETE Result Date: 05/28/2024    ECHOCARDIOGRAM REPORT   Patient Name:   Victor Willis Date of Exam: 05/28/2024 Medical Rec #:  [id number]           Height:       60.0 in Accession #:    [id number]          Weight:       127.7 lb Date of Birth:  10/17/1956           BSA:          1.543 m Patient Age:    68 years            BP:           128/89 mmHg Patient Gender: M                   HR:           99 bpm. Exam Location:  [facility] Procedure: 2D Echo, Cardiac Doppler, Color Doppler, Strain Analysis and 3D Echo            (Both Spectral and Color Flow  Doppler were utilized during            procedure). Indications:     CHF-acute diastolic I50.31  History:         Patient has no prior history of Echocardiogram examinations.                  Risk Factors:Hypertension. CKD.  Sonographer:     Broadus Canes Referring Phys:  [id number] SONA PATEL Diagnosing Phys: Belva Boyden MD  Sonographer Comments: Global longitudinal strain was attempted. IMPRESSIONS  1. Left ventricular ejection fraction, by estimation, is <20%. Left ventricular ejection fraction by 3D volume is 11 %. Left ventricular ejection fraction by PLAX is 12 %. The left ventricle has severely decreased function. The left ventricle demonstrates global hypokinesis. The left ventricular internal cavity size was moderately dilated. Left ventricular diastolic parameters are consistent with Grade II diastolic dysfunction (pseudonormalization).  2. Right ventricular systolic function is normal. The right ventricular size is normal. There is moderately elevated pulmonary artery systolic pressure. The estimated right ventricular systolic pressure is 47.8 mmHg.  3. Left atrial size was moderately dilated.  4. The mitral valve is normal in structure. Moderate mitral valve regurgitation. No evidence of mitral stenosis.  5. Tricuspid valve regurgitation is mild to moderate.  6. The aortic valve is tricuspid. Aortic valve regurgitation is not visualized. No aortic stenosis is present.  7. The inferior vena cava is normal in size with greater than 50% respiratory variability, suggesting right atrial pressure of 3 mmHg. FINDINGS  Left Ventricle: Left ventricular ejection fraction, by estimation, is <20%. Left ventricular ejection fraction by PLAX is 12 %. Left ventricular ejection fraction by 3D volume is 11 %. The left ventricle has severely decreased function. The left ventricle demonstrates global hypokinesis. The left ventricular internal cavity size was moderately dilated. There is no left ventricular hypertrophy. Left  ventricular diastolic parameters are consistent with Grade II diastolic dysfunction (pseudonormalization). Right Ventricle: The right ventricular size is normal. No increase in right ventricular wall thickness. Right ventricular systolic function  is normal. There is moderately elevated pulmonary artery systolic pressure. The tricuspid regurgitant velocity is 3.27 m/s, and with an assumed right atrial pressure of 5 mmHg, the estimated right ventricular systolic pressure is 47.8 mmHg. Left Atrium: Left atrial size was moderately dilated. Right Atrium: Right atrial size was normal in size. Pericardium: There is no evidence of pericardial effusion. Mitral Valve: The mitral valve is normal in structure. Moderate mitral valve regurgitation. No evidence of mitral valve stenosis. Tricuspid Valve: The tricuspid valve is normal in structure. Tricuspid valve regurgitation is mild to moderate. No evidence of tricuspid stenosis. Aortic Valve: The aortic valve is tricuspid. Aortic valve regurgitation is not visualized. No aortic stenosis is present. Aortic valve mean gradient measures 2.0 mmHg. Aortic valve peak gradient measures 4.5 mmHg. Aortic valve area, by VTI measures 1.59 cm. Pulmonic Valve: The pulmonic valve was normal in structure. Pulmonic valve regurgitation is not visualized. No evidence of pulmonic stenosis. Aorta: The aortic root is normal in size and structure. Venous: The inferior vena cava is normal in size with greater than 50% respiratory variability, suggesting right atrial pressure of 3 mmHg. IAS/Shunts: No atrial level shunt detected by color flow Doppler. Additional Comments: 3D was performed not requiring image post processing on an independent workstation and was abnormal.  LEFT VENTRICLE PLAX 2D LV EF:         Left            Diastology                ventricular     LV e' medial:    3.15 cm/s                ejection        LV E/e' medial:  30.6                fraction by     LV e' lateral:   7.29 cm/s                 PLAX is 12      LV E/e' lateral: 13.2                %. LVIDd:         5.70 cm         2D Longitudinal LVIDs:         5.40 cm         Strain LV PW:         1.00 cm         2D Strain GLS   -3.5 % LV IVS:        1.70 cm         Avg: LVOT diam:     2.30 cm LV SV:         24              3D Volume EF LV SV Index:   16              LV 3D EF:    Left LVOT Area:     4.15 cm                     ventricul                                             ar  ejection LV Volumes (MOD)                            fraction LV vol d, MOD    252.0 ml                   by 3D A4C:                                        volume is LV vol s, MOD    230.0 ml                   11 %. A4C: LV SV MOD A4C:   252.0 ml                                3D Volume EF:                                3D EF:        11 % RIGHT VENTRICLE RV Basal diam:  3.40 cm RV Mid diam:    2.50 cm RV S prime:     11.40 cm/s TAPSE (M-mode): 1.5 cm LEFT ATRIUM              Index        RIGHT ATRIUM           Index LA diam:        5.10 cm  3.31 cm/m   RA Area:     19.10 cm LA Vol (A2C):   101.0 ml 65.48 ml/m  RA Volume:   50.70 ml  32.87 ml/m LA Vol (A4C):   74.0 ml  47.97 ml/m LA Biplane Vol: 92.1 ml  59.71 ml/m  AORTIC VALVE AV Area (Vmax):    1.63 cm AV Area (Vmean):   1.52 cm AV Area (VTI):     1.59 cm AV Vmax:           105.50 cm/s AV Vmean:          70.700 cm/s AV VTI:            0.152 m AV Peak Grad:      4.5 mmHg AV Mean Grad:      2.0 mmHg LVOT Vmax:         41.50 cm/s LVOT Vmean:        25.800 cm/s LVOT VTI:          0.058 m LVOT/AV VTI ratio: 0.38  AORTA Ao Root diam: 2.70 cm MITRAL VALVE                  TRICUSPID VALVE MV Area (PHT): 6.37 cm       TR Peak grad:   42.8 mmHg MV Decel Time: 119 msec       TR Vmax:        327.00 cm/s MR Peak grad:    83.9 mmHg MR Mean grad:    53.0 mmHg    SHUNTS MR Vmax:         458.00 cm/s  Systemic VTI:  0.06 m MR Vmean:        340.0 cm/s   Systemic Diam: 2.30  cm MR PISA:  3.08 cm MR PISA Eff ROA: 24 mm MR PISA Radius:  0.70 cm MV E velocity: 96.40 cm/s MV A velocity: 58.70 cm/s MV E/A ratio:  1.64 Belva Boyden MD Electronically signed by Belva Boyden MD Signature Date/Time: 05/28/2024/1:08:47 PM    Final      Medications:    [START ON 05/30/2024] sodium chloride       aspirin EC  81 mg Oral QODAY   furosemide  80 mg Intravenous BID   heparin   5,000 Units Subcutaneous Q8H   nicotine   21 mg Transdermal Daily   oxycodone   30 mg Oral 5 X Daily   patiromer  16.8 g Oral Daily   polyethylene glycol  17 g Oral Daily   sulfaSALAzine   500 mg Oral BID   albuterol  Assessment/ Plan:  68 y.o. 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 He also has chronic hyperkalemia.  He was taking lisinopril/HCTZ at home which can contribute to high potassium.  He denies drinking excessive orange juice but does eat potatoes/chips etc.  He stopped taking Lokelma and Farxiga due to cost issues.  Plan: Renal function improved.  Creatinine down to 2.4.  Potassium also down to 4.2.  Reduce Veltassa to 16.8 g daily.  Continue Lasix as per cardiology.  May consider reducing Veltassa further based upon potassium  trend.   LOS: 3 Victor Willis 6/19/20254:51 PM  [facility] [address] [place], [place] [phone]  Note: This note was prepared with Dragon dictation. Any transcription errors are unintentional

## 2024-05-30 ENCOUNTER — Encounter
Admission: EM | Disposition: A | Discharge status: Home / Self Care | Payer: Self-pay | Source: Home / Self Care | Attending: Internal Medicine

## 2024-05-30 ENCOUNTER — Other Ambulatory Visit (HOSPITAL_COMMUNITY): Payer: Self-pay

## 2024-05-30 DIAGNOSIS — I420 Dilated cardiomyopathy: Secondary | ICD-10-CM | POA: Diagnosis not present

## 2024-05-30 DIAGNOSIS — E8770 Fluid overload, unspecified: Secondary | ICD-10-CM | POA: Diagnosis not present

## 2024-05-30 DIAGNOSIS — Z515 Encounter for palliative care: Secondary | ICD-10-CM

## 2024-05-30 DIAGNOSIS — I10 Essential (primary) hypertension: Secondary | ICD-10-CM | POA: Diagnosis not present

## 2024-05-30 DIAGNOSIS — I5021 Acute systolic (congestive) heart failure: Secondary | ICD-10-CM | POA: Diagnosis not present

## 2024-05-30 HISTORY — PX: RIGHT HEART CATH: CATH118263

## 2024-05-30 LAB — BASIC METABOLIC PANEL WITH GFR
Anion gap: 8 (ref 5–15)
BUN: 56 mg/dL — ABNORMAL HIGH (ref 8–23)
CO2: 22 mmol/L (ref 22–32)
Calcium: 8.4 mg/dL — ABNORMAL LOW (ref 8.9–10.3)
Chloride: 106 mmol/L (ref 98–111)
Creatinine, Ser: 2.45 mg/dL — ABNORMAL HIGH (ref 0.61–1.24)
GFR, Estimated: 28 mL/min — ABNORMAL LOW (ref >60)
Glucose, Bld: 119 mg/dL — ABNORMAL HIGH (ref 70–99)
Potassium: 3.7 mmol/L (ref 3.5–5.1)
Sodium: 136 mmol/L (ref 135–145)

## 2024-05-30 LAB — POCT I-STAT EG7
Acid-base deficit: 4 mmol/L — ABNORMAL HIGH (ref 0.0–2.0)
Bicarbonate: 20.8 mmol/L (ref 20.0–28.0)
Calcium, Ion: 1.21 mmol/L (ref 1.15–1.40)
HCT: 33 % — ABNORMAL LOW (ref 39.0–52.0)
Hemoglobin: 11.2 g/dL — ABNORMAL LOW (ref 13.0–17.0)
O2 Saturation: 58 %
Potassium: 3.5 mmol/L (ref 3.5–5.1)
Sodium: 137 mmol/L (ref 135–145)
TCO2: 22 mmol/L (ref 22–32)
pCO2, Ven: 37.1 mmHg — ABNORMAL LOW (ref 44–60)
pH, Ven: 7.358 (ref 7.25–7.43)
pO2, Ven: 31 mmHg — CL (ref 32–45)

## 2024-05-30 SURGERY — RIGHT HEART CATH
Anesthesia: Moderate Sedation

## 2024-05-30 MED ORDER — HEPARIN (PORCINE) IN NACL 1000-0.9 UT/500ML-% IV SOLN
INTRAVENOUS | Status: DC | PRN
  Administered 2024-05-30: 1000 mL

## 2024-05-30 MED ORDER — LIDOCAINE HCL (PF) 1 % IJ SOLN
INTRAMUSCULAR | Status: DC | PRN
  Administered 2024-05-30: 2 mL

## 2024-05-30 MED ORDER — HYDRALAZINE HCL 25 MG PO TABS
25.0000 mg | ORAL_TABLET | Freq: Three times a day (TID) | ORAL | Status: DC
  Administered 2024-05-30 – 2024-05-31 (×3): 25 mg via ORAL
  Filled 2024-05-30 (×4): qty 1

## 2024-05-30 MED ORDER — LIDOCAINE HCL 1 % IJ SOLN
INTRAMUSCULAR | Status: AC
  Filled 2024-05-30: qty 20

## 2024-05-30 SURGICAL SUPPLY — 7 items
CATH SWAN GANZ 7F STRAIGHT (CATHETERS) IMPLANT
DRAPE BRACHIAL (DRAPES) IMPLANT
GLIDESHEATH SLENDER 7FR .021G (SHEATH) IMPLANT
GUIDEWIRE .025 260CM (WIRE) IMPLANT
PACK CARDIAC CATH (CUSTOM PROCEDURE TRAY) ×1 IMPLANT
SET ATX-X65L (MISCELLANEOUS) IMPLANT
STATION PROTECTION PRESSURIZED (MISCELLANEOUS) IMPLANT

## 2024-05-30 NOTE — Consult Note (Cosign Needed)
 Consultation Note Date: 05/30/2024   Patient Name: Christoph Copelan Sigmund  DOB: 1956-08-16  MRN: [id number]  Age / Sex: 68 y.o., male  PCP: Pcp, No Referring Physician: Melvinia Stager, MD  Reason for Consultation: Establishing goals of care   HPI/Brief Hospital Course: 68 y.o. male  with past medical history of CKD stage IV, type 2 diabetes, aortic atherosclerosis, hypertension, prior heavy EtOH use with associated alcoholic pancreatitis, ongoing tobacco use, ankylosing spondylitis, diverticulosis, macrocytic anemia, previous back and hip surgery continues to be followed by orthopedic surgeon in [place]  admitted on 05/26/2024 with labs obtained from orthopedic surgeon and confirmed by local PCP with results revealing hyperkalemia-6.4.  Admitted and being treated for hyperkalemia, acute on chronic kidney disease stage IV, acute systolic congestive heart failure with severe cardiomyopathy-echo this admission revealing EF less than 20% Now being followed by advanced heart failure team S/p RHC today  Palliative medicine was consulted for assisting with goals of care conversations.  Subjective:  Extensive chart review has been completed prior to meeting patient including labs, vital signs, imaging, progress notes, orders, and available advanced directive documents from current and previous encounters.  Attempted to visit with Mr. Klarich earlier in the day but he was out of his room for heart catheterization.  Returned later in the afternoon and visited with Mr. Friedmann at his bedside along with his daughter and son.  Introduced myself as a Publishing rights manager as a member of the palliative care team. Explained palliative medicine is specialized medical care for people living with serious illness. It focuses on providing relief from the symptoms and stress of a serious illness. The goal is to improve quality of life for both the patient and the family.   Mr. Morrish  provides a brief life review.  He is currently engaged to [place].  He has a total of 3 children, 2 sons and 1 daughter.  One of his sons currently lives here with him in [place]  and his other 2 children still live in [place] .  In the past Mr. Awbrey has lived in many places but has been in   for about 10 years.  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  At baseline prior to admission Mr. Slaven is very independent and a very active person.  Mr. Dineen shares he had a recent follow-up visit with his orthopedic surgeon from [place]  who obtained routine labs revealing hyperkalemia, he then followed up with his local PCP and labs were obtained and confirmed hyperkalemia which prompted ED visit.  Mr. Toon and his children are able to explain their understanding of current medical situation.  His daughter shares that she currently works in the medical field and has a good understanding.  They are all aware of his underlying chronic kidney disease as well as diagnosis of heart failure and significant cardiomyopathy.  Aware at this time due to renal function he is not a good candidate for left heart catheterization.  They are aware at this time medical management continues.  We discussed patient's current illness and what it means in the larger context of patient's on-going co-morbidities. Natural disease trajectory and expectations at EOL were discussed.   As our discussions continued regarding goals of care, Mr. Jurewicz becomes tired and request visit to continue tomorrow when all of his children and his fiance will be at bedside.  I discussed importance of continued conversations with family/support persons and all members of  their medical team regarding overall plan of care and treatment options ensuring decisions are in alignment with patients goals of care.  All questions/concerns addressed.  PMT will continue to  follow and support patient as needed.  Will follow-up at bedside with Mr. Gentles and his family around 1 PM tomorrow   Objective: Primary Diagnoses: Present on Admission:  Fluid overload  Essential hypertension  Tobacco abuse  Alcohol abuse   Physical Exam Constitutional:      General: He is not in acute distress.    Appearance: He is not ill-appearing.  Pulmonary:     Effort: Pulmonary effort is normal. No respiratory distress.  Abdominal:     General: There is no distension.   Skin:    General: Skin is warm and dry.   Neurological:     Mental Status: He is alert and oriented to person, place, and time.   Psychiatric:        Mood and Affect: Mood normal.        Thought Content: Thought content normal.     Vital Signs: BP 131/70 (BP Location: Left Arm)   Pulse 97   Temp 97.7 F (36.5 C)   Resp (!) 21   Ht 5' (1.524 m)   Wt 54.9 kg   SpO2 97%   BMI 23.64 kg/m  Pain Scale: 0-10 POSS *See Group Information*: 1-Acceptable,Awake and alert Pain Score: 8   IO: Intake/output summary:  Intake/Output Summary (Last 24 hours) at 05/30/2024 1703 Last data filed at 05/30/2024 1000 Gross per 24 hour  Intake 170 ml  Output 450 ml  Net -280 ml    LBM: Last BM Date : 05/29/24 Baseline Weight: Weight: 59 kg Most recent weight: Weight: 54.9 kg       Palliative Assessment/Data: 90%   Assessment and Plan  SUMMARY OF RECOMMENDATIONS   Resume goals of care conversations with all family present tomorrow afternoon at bedside  Palliative Prophylaxis:   Bowel Regimen, Delirium Protocol and Frequent Pain Assessment   Thank you for this consult and allowing Palliative Medicine to participate in the care of Daiton M. Loftin. Palliative medicine will continue to follow and assist as needed.   Time Total: 55 minutes  Time spent includes: Detailed review of medical records (labs, imaging, vital signs), medically appropriate exam (mental status, respiratory, cardiac, skin),  discussed with treatment team, counseling and educating patient, family and staff, documenting clinical information, medication management and coordination of care.   Signed by: Isadore Marble, DNP, AGNP-C Palliative Medicine    Please contact Palliative Medicine Team phone at [phone] for questions and concerns.  For individual provider: See Tilford Foley

## 2024-05-30 NOTE — Progress Notes (Signed)
 Advanced Heart Failure Team Consult Note   Primary Physician: Pcp, No Cardiologist:  None  Reason for Consultation: Acute systolic HF  Interval hx:   - Improvement in symptoms - RHC today with low filling pressures and CI of 2.5 L/min/m2.   Objective:    Vital Signs:   Temp:  [97.4 F (36.3 C)-98.2 F (36.8 C)] 97.7 F (36.5 C) (06/20 1639) Pulse Rate:  [85-102] 97 (06/20 1639) Resp:  [13-23] 21 (06/20 1404) BP: (131-153)/(70-98) 131/70 (06/20 1639) SpO2:  [94 %-99 %] 97 % (06/20 1639) Weight:  [54.9 kg] 54.9 kg (06/20 0500) Last BM Date : 05/29/24  Weight change: Filed Weights   05/28/24 0610 05/29/24 0500 05/30/24 0500  Weight: 57.9 kg 56.7 kg 54.9 kg    Intake/Output:   Intake/Output Summary (Last 24 hours) at 05/30/2024 1831 Last data filed at 05/30/2024 1000 Gross per 24 hour  Intake 170 ml  Output 450 ml  Net -280 ml      Physical Exam   Vitals:   05/30/24 1430 05/30/24 1639  BP: (!) 146/83 131/70  Pulse: 94 97  Resp:    Temp: 98.1 F (36.7 C) 97.7 F (36.5 C)  SpO2: 94% 97%   GENERAL: NAD Lungs- CTA CARDIAC:  JVP: 6 cm          Normal rate with regular rhythm. no murmur.  Pulses 2+. no edema.  ABDOMEN: Soft, non-tender, non-distended.  EXTREMITIES: Warm and well perfused.  NEUROLOGIC: No obvious FND    Telemetry   Sinus 80-90s Personally reviewed   EKG    Sinus tach 119 LVH with repol qrs Personally reviewed   Labs   Basic Metabolic Panel: Recent Labs  Lab 05/26/24 1414 05/26/24 2059 05/27/24 0532 05/28/24 0601 05/28/24 1438 05/29/24 0311 05/30/24 0814 05/30/24 1355  NA 139  --  138 138  --  136 136 137  K 5.3*  --  5.6* 6.0* 5.0 4.2 3.7 3.5  CL 110  --  110 109  --  108 106  --   CO2 18*  --  20* 20*  --  18* 22  --   GLUCOSE 171*  --  123* 129*  --  122* 119*  --   BUN 51*  --  60* 58*  --  56* 56*  --   CREATININE 2.66* 2.78* 2.91* 2.71*  --  2.41* 2.45*  --   CALCIUM 8.1*  --  8.2* 8.5*  --  8.4* 8.4*   --   MG  --   --   --   --   --  1.9  --   --   PHOS  --   --   --   --   --  5.7*  --   --     Liver Function Tests: Recent Labs  Lab 05/28/24 0601 05/29/24 0311  AST 22  --   ALT 11  --   ALKPHOS 63  --   BILITOT 0.5  --   PROT 6.4*  --   ALBUMIN 2.9* 2.6*   No results for input(s): LIPASE, AMYLASE in the last 168 hours. No results for input(s): AMMONIA in the last 168 hours.  CBC: Recent Labs  Lab 05/26/24 1414 05/26/24 2059 05/27/24 0532 05/30/24 1355  WBC 8.0 10.0 8.1  --   HGB 10.8* 9.9* 10.7* 11.2*  HCT 34.1* 31.3* 32.9* 33.0*  MCV 100.9* 101.0* 100.6*  --   PLT 291 273 284  --  Cardiac Enzymes: No results for input(s): CKTOTAL, CKMB, CKMBINDEX, TROPONINI in the last 168 hours.  BNP: BNP (last 3 results) Recent Labs    05/26/24 1414  BNP 3,571.6*    ProBNP (last 3 results) No results for input(s): PROBNP in the last 8760 hours.   CBG: No results for input(s): GLUCAP in the last 168 hours.  Coagulation Studies: No results for input(s): LABPROT, INR in the last 72 hours.    Imaging   No results found.    Medications:     Current Medications:  aspirin EC  81 mg Oral QODAY   heparin   5,000 Units Subcutaneous Q8H   hydrALAZINE  25 mg Oral Q8H   nicotine   21 mg Transdermal Daily   oxycodone   30 mg Oral 5 X Daily   patiromer  16.8 g Oral Daily   polyethylene glycol  17 g Oral Daily   sulfaSALAzine   500 mg Oral BID   traZODone  25 mg Oral Once    Infusions:    Assessment/Plan   1. Acute systolic HF - Echo 6/25 EF < 20 RV mild to moderately reduced - Suspect ETOH CM but also at high risk for underlying CAD - NYHA IV  - Not candidate for coronary angiography with CKD IV. Refuses MRI - Not candidate for advanced therapies due to severe lung and kidney disease - RHC today with low filling pressures & cardiac index of 2.5L/min/m2.  - Start hydralazine 25mg  TID for afterload reduction; can adjust according to  BP.  - Hold further diuretics.  - Stable for D/C tomorrow. Discussed meds with pharmD.   2. AKI on CKD IV -baseline 2.0-22 - see above; sCr stable.   3. COPD with ongoing tobacco use - severe on exam - discussed need for cessation  4. ETOH abuse - watch for WD - discussed need for cessation  5. Hyperkalemia - Renal managing with Veltassa  6. Ankylosing spondylitis - stable  Terita Hejl, DO  6:31 PM   Length of Stay: 4  Advanced Heart Failure Team Pager [phone] (M-F; 7a - 5p)  Please contact [facility] for night-coverage after hours (4p -7a ) and weekends on [email or web address]

## 2024-05-30 NOTE — Progress Notes (Signed)
 [facility]  - [place] at [facility]   PATIENT NAME: Victor Willis    MR#:  [id number]  DATE OF BIRTH:  30-Sep-1956  SUBJECTIVE:  patient's daughter, wife and son at bedside. Patient urinating well. Sitting at the edge of the bed. Denies respiratory distress sats more than 92% on room air. Leg swelling improving. Awaiting cath for today  VITALS:  Blood pressure (!) 133/98, pulse 91, temperature 98 F (36.7 C), resp. rate 20, height 5' (1.524 m), weight 54.9 kg, SpO2 97%.  PHYSICAL EXAMINATION:   GENERAL:  68 y.o.-year-old patient with no acute distress.  LUNGS: decreased breath sounds bilaterally, no wheezing CARDIOVASCULAR: S1, S2 normal. No murmur   ABDOMEN: Soft, nontender, nondistended. Bowel sounds present.  EXTREMITIES: ++edema b/l.   Teds+ NEUROLOGIC: nonfocal  patient is alert and awake  LABORATORY PANEL:  CBC Recent Labs  Lab 05/27/24 0532  WBC 8.1  HGB 10.7*  HCT 32.9*  PLT 284    Chemistries  Recent Labs  Lab 05/28/24 0601 05/28/24 1438 05/29/24 0311 05/30/24 0814  NA 138  --  136 136  K 6.0*   < > 4.2 3.7  CL 109  --  108 106  CO2 20*  --  18* 22  GLUCOSE 129*  --  122* 119*  BUN 58*  --  56* 56*  CREATININE 2.71*  --  2.41* 2.45*  CALCIUM 8.5*  --  8.4* 8.4*  MG  --   --  1.9  --   AST 22  --   --   --   ALT 11  --   --   --   ALKPHOS 63  --   --   --   BILITOT 0.5  --   --   --    < > = values in this interval not displayed.   Cardiac Enzymes No results for input(s): TROPONINI in the last 168 hours. RADIOLOGY:  ECHOCARDIOGRAM COMPLETE Result Date: 05/28/2024    ECHOCARDIOGRAM REPORT   Patient Name:   Victor Willis Date of Exam: 05/28/2024 Medical Rec #:  [id number]           Height:       60.0 in Accession #:    [id number]          Weight:       127.7 lb Date of Birth:  September 12, 1956           BSA:          1.543 m Patient Age:    68 years            BP:           128/89 mmHg Patient Gender: M                   HR:            99 bpm. Exam Location:  [facility] Procedure: 2D Echo, Cardiac Doppler, Color Doppler, Strain Analysis and 3D Echo            (Both Spectral and Color Flow Doppler were utilized during            procedure). Indications:     CHF-acute diastolic I50.31  History:         Patient has no prior history of Echocardiogram examinations.                  Risk Factors:Hypertension. CKD.  Sonographer:     Broadus Canes  Referring Phys:  [id number] Di Jasmer Diagnosing Phys: Belva Boyden MD  Sonographer Comments: Global longitudinal strain was attempted. IMPRESSIONS  1. Left ventricular ejection fraction, by estimation, is <20%. Left ventricular ejection fraction by 3D volume is 11 %. Left ventricular ejection fraction by PLAX is 12 %. The left ventricle has severely decreased function. The left ventricle demonstrates global hypokinesis. The left ventricular internal cavity size was moderately dilated. Left ventricular diastolic parameters are consistent with Grade II diastolic dysfunction (pseudonormalization).  2. Right ventricular systolic function is normal. The right ventricular size is normal. There is moderately elevated pulmonary artery systolic pressure. The estimated right ventricular systolic pressure is 47.8 mmHg.  3. Left atrial size was moderately dilated.  4. The mitral valve is normal in structure. Moderate mitral valve regurgitation. No evidence of mitral stenosis.  5. Tricuspid valve regurgitation is mild to moderate.  6. The aortic valve is tricuspid. Aortic valve regurgitation is not visualized. No aortic stenosis is present.  7. The inferior vena cava is normal in size with greater than 50% respiratory variability, suggesting right atrial pressure of 3 mmHg. FINDINGS  Left Ventricle: Left ventricular ejection fraction, by estimation, is <20%. Left ventricular ejection fraction by PLAX is 12 %. Left ventricular ejection fraction by 3D volume is 11 %. The left ventricle has severely decreased function. The left ventricle  demonstrates global hypokinesis. The left ventricular internal cavity size was moderately dilated. There is no left ventricular hypertrophy. Left ventricular diastolic parameters are consistent with Grade II diastolic dysfunction (pseudonormalization). Right Ventricle: The right ventricular size is normal. No increase in right ventricular wall thickness. Right ventricular systolic function is normal. There is moderately elevated pulmonary artery systolic pressure. The tricuspid regurgitant velocity is 3.27 m/s, and with an assumed right atrial pressure of 5 mmHg, the estimated right ventricular systolic pressure is 47.8 mmHg. Left Atrium: Left atrial size was moderately dilated. Right Atrium: Right atrial size was normal in size. Pericardium: There is no evidence of pericardial effusion. Mitral Valve: The mitral valve is normal in structure. Moderate mitral valve regurgitation. No evidence of mitral valve stenosis. Tricuspid Valve: The tricuspid valve is normal in structure. Tricuspid valve regurgitation is mild to moderate. No evidence of tricuspid stenosis. Aortic Valve: The aortic valve is tricuspid. Aortic valve regurgitation is not visualized. No aortic stenosis is present. Aortic valve mean gradient measures 2.0 mmHg. Aortic valve peak gradient measures 4.5 mmHg. Aortic valve area, by VTI measures 1.59 cm. Pulmonic Valve: The pulmonic valve was normal in structure. Pulmonic valve regurgitation is not visualized. No evidence of pulmonic stenosis. Aorta: The aortic root is normal in size and structure. Venous: The inferior vena cava is normal in size with greater than 50% respiratory variability, suggesting right atrial pressure of 3 mmHg. IAS/Shunts: No atrial level shunt detected by color flow Doppler. Additional Comments: 3D was performed not requiring image post processing on an independent workstation and was abnormal.  LEFT VENTRICLE PLAX 2D LV EF:         Left            Diastology                 ventricular     LV e' medial:    3.15 cm/s                ejection        LV E/e' medial:  30.6  fraction by     LV e' lateral:   7.29 cm/s                PLAX is 12      LV E/e' lateral: 13.2                %. LVIDd:         5.70 cm         2D Longitudinal LVIDs:         5.40 cm         Strain LV PW:         1.00 cm         2D Strain GLS   -3.5 % LV IVS:        1.70 cm         Avg: LVOT diam:     2.30 cm LV SV:         24              3D Volume EF LV SV Index:   16              LV 3D EF:    Left LVOT Area:     4.15 cm                     ventricul                                             ar                                             ejection LV Volumes (MOD)                            fraction LV vol d, MOD    252.0 ml                   by 3D A4C:                                        volume is LV vol s, MOD    230.0 ml                   11 %. A4C: LV SV MOD A4C:   252.0 ml                                3D Volume EF:                                3D EF:        11 % RIGHT VENTRICLE RV Basal diam:  3.40 cm RV Mid diam:    2.50 cm RV S prime:     11.40 cm/s TAPSE (M-mode): 1.5 cm LEFT ATRIUM              Index        RIGHT ATRIUM  Index LA diam:        5.10 cm  3.31 cm/m   RA Area:     19.10 cm LA Vol (A2C):   101.0 ml 65.48 ml/m  RA Volume:   50.70 ml  32.87 ml/m LA Vol (A4C):   74.0 ml  47.97 ml/m LA Biplane Vol: 92.1 ml  59.71 ml/m  AORTIC VALVE AV Area (Vmax):    1.63 cm AV Area (Vmean):   1.52 cm AV Area (VTI):     1.59 cm AV Vmax:           105.50 cm/s AV Vmean:          70.700 cm/s AV VTI:            0.152 m AV Peak Grad:      4.5 mmHg AV Mean Grad:      2.0 mmHg LVOT Vmax:         41.50 cm/s LVOT Vmean:        25.800 cm/s LVOT VTI:          0.058 m LVOT/AV VTI ratio: 0.38  AORTA Ao Root diam: 2.70 cm MITRAL VALVE                  TRICUSPID VALVE MV Area (PHT): 6.37 cm       TR Peak grad:   42.8 mmHg MV Decel Time: 119 msec       TR Vmax:        327.00 cm/s MR Peak grad:     83.9 mmHg MR Mean grad:    53.0 mmHg    SHUNTS MR Vmax:         458.00 cm/s  Systemic VTI:  0.06 m MR Vmean:        340.0 cm/s   Systemic Diam: 2.30 cm MR PISA:         3.08 cm MR PISA Eff ROA: 24 mm MR PISA Radius:  0.70 cm MV E velocity: 96.40 cm/s MV A velocity: 58.70 cm/s MV E/A ratio:  1.64 Belva Boyden MD Electronically signed by Belva Boyden MD Signature Date/Time: 05/28/2024/1:08:47 PM    Final    Assessment and Plan   Victor Willis is a 68 y.o. male with a hx of coronary artery calcification noted on CT imaging, CKD stage IV, DM2, aortic atherosclerosis, HTN, prior heavy alcohol use with history of alcoholic pancreatitis, ongoing tobacco use, ankylosing spondylitis, macrocytic anemia, diverticulosis, and prior back and hip surgery came in after he was asked by orthopedic surgery to come to the emergency room for potassium 6.4.  Hyperkalemia acute on chronic kidney disease stage IV/type II diabetes -- patient's potassium is down to 5.6 -- nephrology consultation with Dr. Christain Courser on [facility] -- receiving IV Lasix monitor input output and metabolic panel --?Cardiorenal syndrome --K 6.0--d/w Dr Lateef--recommends Veltassa bid- --renal diet --k down to 4.2--change to every day dosing of veltassa --creat down to 2.45 ( Baseline 2.0--2.2)  Volume overload/fluid overload/acute systolic congestive heart failure severe cardiomyopathy with EF of 20% -- IV Lasix BID monitor input output creatinine--increased dose to 80 mg bid -- cardiology consultation with [facility] cardiology -- Echo shows EF of less than 20%. Global dyskinesia -- daily weights -- advance heart failure team --appreciate input. --plans for RHC for tomorrow  History of smoking and alcohol use -- recommend cessation  Elevated troponin appears demand ischemia in the setting of CKD stage IV -- no chest pain -- continue aspirin  Procedures: Family communication : daughter, wife Consults : [facility] cardiology,  nephrology CODE STATUS: full DVT Prophylaxis : heparin  Level of care: Telemetry Cardiac Status is: Inpatient Remains inpatient appropriate because: CHF, acute on chronic CKD, Heart cath    TOTAL TIME TAKING CARE OF THIS PATIENT: 35 minutes.  >50% time spent on counselling and coordination of care  Note: This dictation was prepared with Dragon dictation along with smaller phrase technology. Any transcriptional errors that result from this process are unintentional.  Melvinia Stager M.D    [facility]   CC: Primary care physician; Pcp, No

## 2024-05-30 NOTE — TOC Progression Note (Signed)
 Transition of Care [facility]) - Progression Note    Patient Details  Name: Victor Willis MRN: [id number] Date of Birth: December 16, 1955  [facility] CM/SW Contact  Baird Bombard, RN Phone Number: 05/30/2024, 11:09 AM  Clinical Narrative:    TOC continuing to follow patient's progress throughout discharge planning.        Expected Discharge Plan and Services                                               Social Determinants of Health (SDOH) Interventions SDOH Screenings   Food Insecurity: No Food Insecurity (05/26/2024)  Housing: Low Risk  (05/26/2024)  Transportation Needs: No Transportation Needs (05/26/2024)  Utilities: Not At Risk (05/26/2024)  Financial Resource Strain: Patient Declined (04/09/2023)   Received from [facility]  Social Connections: Socially Isolated (05/26/2024)  Tobacco Use: High Risk (05/27/2024)    Readmission Risk Interventions     No data to display

## 2024-05-30 NOTE — Interval H&P Note (Signed)
 History and Physical Interval Note:  05/30/2024 1:36 PM  Victor Willis  has presented today for surgery, with the diagnosis of acute systolic heart failure.  The various methods of treatment have been discussed with the patient and family. After consideration of risks, benefits and other options for treatment, the patient has consented to  Procedure(s): RIGHT HEART CATH (N/A) as a surgical intervention.  The patient's history has been reviewed, patient examined, no change in status, stable for surgery.  I have reviewed the patient's chart and labs.  Questions were answered to the patient's satisfaction.     Hakop Humbarger

## 2024-05-30 NOTE — Plan of Care (Signed)

## 2024-05-31 DIAGNOSIS — E8770 Fluid overload, unspecified: Secondary | ICD-10-CM | POA: Diagnosis not present

## 2024-05-31 DIAGNOSIS — N179 Acute kidney failure, unspecified: Secondary | ICD-10-CM

## 2024-05-31 LAB — BASIC METABOLIC PANEL WITH GFR
Anion gap: 10 (ref 5–15)
BUN: 58 mg/dL — ABNORMAL HIGH (ref 8–23)
CO2: 18 mmol/L — ABNORMAL LOW (ref 22–32)
Calcium: 8 mg/dL — ABNORMAL LOW (ref 8.9–10.3)
Chloride: 105 mmol/L (ref 98–111)
Creatinine, Ser: 2.43 mg/dL — ABNORMAL HIGH (ref 0.61–1.24)
GFR, Estimated: 28 mL/min — ABNORMAL LOW (ref >60)
Glucose, Bld: 122 mg/dL — ABNORMAL HIGH (ref 70–99)
Potassium: 3.6 mmol/L (ref 3.5–5.1)
Sodium: 133 mmol/L — ABNORMAL LOW (ref 135–145)

## 2024-05-31 MED ORDER — POTASSIUM CHLORIDE CRYS ER 20 MEQ PO TBCR
40.0000 meq | EXTENDED_RELEASE_TABLET | Freq: Once | ORAL | Status: AC
  Administered 2024-05-31: 40 meq via ORAL
  Filled 2024-05-31: qty 2

## 2024-05-31 MED ORDER — FUROSEMIDE 20 MG PO TABS: 20.0000 mg | ORAL_TABLET | Freq: Every day | ORAL | 3 refills | Status: DC

## 2024-05-31 MED ORDER — FUROSEMIDE 20 MG PO TABS: 20.0000 mg | ORAL_TABLET | Freq: Every day | ORAL | Status: DC

## 2024-05-31 MED ORDER — HYDRALAZINE HCL 25 MG PO TABS: 25.0000 mg | ORAL_TABLET | Freq: Three times a day (TID) | ORAL | 2 refills | Status: DC

## 2024-05-31 NOTE — Progress Notes (Signed)
 Rounding Note   Patient Name: Victor Willis Date of Encounter: 05/31/2024  [facility] Cardiologist: [facility]- Dr. Mady   Subjective Resting comfortably in bed, family at the bedside Reports he would like to go home Reports he has been ambulating to bathroom Laying supine, denies significant shortness of breath Discussed results from right heart catheterization performed yesterday showing normal pressures Stable renal function Normal sinus rhythm on telemetry  Scheduled Meds:  aspirin  EC  81 mg Oral QODAY   [START ON 06/01/2024] furosemide   20 mg Oral Daily   heparin   5,000 Units Subcutaneous Q8H   hydrALAZINE   25 mg Oral Q8H   nicotine   21 mg Transdermal Daily   oxycodone   30 mg Oral 5 X Daily   polyethylene glycol  17 g Oral Daily   sulfaSALAzine   500 mg Oral BID   traZODone   25 mg Oral Once   Continuous Infusions:  PRN Meds: albuterol   Vital Signs  Vitals:   05/31/24 0019 05/31/24 0331 05/31/24 0500 05/31/24 0823  BP: 117/67 116/72  114/68  Pulse: (!) 102 94  97  Resp: 20 20    Temp: 98.1 F (36.7 C) 98.2 F (36.8 C)  (!) 96.6 F (35.9 C)  TempSrc: Oral Oral    SpO2: 97% 96%  99%  Weight:   54.3 kg   Height:        Intake/Output Summary (Last 24 hours) at 05/31/2024 1126 Last data filed at 05/31/2024 1100 Gross per 24 hour  Intake 0 ml  Output 1075 ml  Net -1075 ml      05/31/2024    5:00 AM 05/30/2024    5:00 AM 05/29/2024    5:00 AM  Last 3 Weights  Weight (lbs) 119 lb 11.4 oz 121 lb 0.5 oz 125 lb  Weight (kg) 54.3 kg 54.9 kg 56.7 kg      Telemetry Normal sinus rhythm- Personally Reviewed  ECG   - Personally Reviewed  Physical Exam  Constitutional:  oriented to person, place, and time. No distress.  HENT:  Head: Grossly normal Eyes:  no discharge. No scleral icterus.  Neck: JVD 8+, no carotid bruits  Cardiovascular: Regular rate and rhythm, no murmurs appreciated Pulmonary/Chest:  Abdominal: Soft.  no distension.  no  tenderness.  Musculoskeletal: Normal range of motion Neurological:  normal muscle tone. Coordination normal. No atrophy Skin: Skin warm and dry Psychiatric: normal affect, pleasant  Labs High Sensitivity Troponin:   Recent Labs  Lab 05/26/24 1414 05/26/24 1624 05/26/24 2059 05/26/24 2128  TROPONINIHS 59* 57* 68* 69*     Chemistry Recent Labs  Lab 05/28/24 0601 05/28/24 1438 05/29/24 0311 05/30/24 0814 05/30/24 1355 05/31/24 0454  NA 138  --  136 136 137 133*  K 6.0*   < > 4.2 3.7 3.5 3.6  CL 109  --  108 106  --  105  CO2 20*  --  18* 22  --  18*  GLUCOSE 129*  --  122* 119*  --  122*  BUN 58*  --  56* 56*  --  58*  CREATININE 2.71*  --  2.41* 2.45*  --  2.43*  CALCIUM  8.5*  --  8.4* 8.4*  --  8.0*  MG  --   --  1.9  --   --   --   PROT 6.4*  --   --   --   --   --   ALBUMIN 2.9*  --  2.6*  --   --   --  AST 22  --   --   --   --   --   ALT 11  --   --   --   --   --   ALKPHOS 63  --   --   --   --   --   BILITOT 0.5  --   --   --   --   --   GFRNONAA 25*  --  29* 28*  --  28*  ANIONGAP 9  --  10 8  --  10   < > = values in this interval not displayed.    Lipids No results for input(s): CHOL, TRIG, HDL, LABVLDL, LDLCALC, CHOLHDL in the last 168 hours.  Hematology Recent Labs  Lab 05/26/24 1414 05/26/24 2059 05/27/24 0532 05/30/24 1355  WBC 8.0 10.0 8.1  --   RBC 3.38* 3.10* 3.27*  --   HGB 10.8* 9.9* 10.7* 11.2*  HCT 34.1* 31.3* 32.9* 33.0*  MCV 100.9* 101.0* 100.6*  --   MCH 32.0 31.9 32.7  --   MCHC 31.7 31.6 32.5  --   RDW 14.0 14.0 14.1  --   PLT 291 273 284  --    Thyroid No results for input(s): TSH, FREET4 in the last 168 hours.  BNP Recent Labs  Lab 05/26/24 1414  BNP 3,571.6*    DDimer No results for input(s): DDIMER in the last 168 hours.   Radiology  CARDIAC CATHETERIZATION Result Date: 05/30/2024 HEMODYNAMICS: RA:   2 mmHg (mean) RV:   29/2 mmHg PA:   32/13 mmHg (20 mean) PCWP:  16 mmHg (mean)    Estimated Fick  CO/CI: 3.3 L/min, 2.2 L/min/m2 Thermodilution CO/CI: 4 L/min, 2.6 L/min/m2    TPG    4  mmHg     PVR     ~1 Wood Units PAPi      >5  IMPRESSION: Normal to low right and left heart filling pressures Normal cardiac index by TD; mild to moderately reduced by Fick Normal PVR.    Cardiac Studies Echo with severely reduced ejection fraction less than 20%  Patient Profile   Victor Willis is a 68 y.o. 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%  Assessment & Plan  Acute systolic CHF worsening shortness of breath, edema as outpatient, presenting with acute on chronic renal failure Volume overload on arrival with leg swelling, abdominal distention -Echocardiogram detailing severely reduced LV function less than 20% dilated LV - Etiology of his cardiomyopathy unclear, troponin minimally elevated nontrending, prior history alcohol, smoking history - Given worsening renal function not a great candidate for cardiac catheterization, unable to perform ischemic workup - ACE/ARB/ARNI on hold in the setting of renal failure, hyperkalemia - Treated this admission with IV Lasix  Right heart catheterization, has achieved euvolemic state - On hydralazine  25 mg 3 times daily, Lasix  20 starting tomorrow Long discussion concerning monitoring daily weights, blood pressure at home  Coronary artery calcification, elevated troponin - Severely reduced LV function, denies anginal symptoms - Long history of smoking, hyperlipidemia, alcohol history - Not a good candidate for ischemic workup  Hyperkalemia Nephrology following, receiving  Lokelma  Potassium level improved ACE/Arni/ARB  on hold  Acute on chronic kidney disease stage IV Possibly exacerbated by low cardiac output, chronic kidney disease mixed with component of cardiorenal syndrome IV Lasix  transition to Lasix  20 daily  Alcohol and smoking abuse Cessation recommended He reports that he is not a heavy drinker  For questions or updates, please contact [facility] Please consult [email or web address] for contact info under     Signed, Aleja Yearwood, MD  05/31/2024, 11:26 AM

## 2024-05-31 NOTE — Progress Notes (Signed)
 [facility] Liaison Note  Received a referral from [facility], Marinda Cooks, RN, for outpatient palliative care services for patient.    Referral submitted today.  Please call with any palliative care questions or concerns.  Thank you for the opportunity  to participate in this patient's care.  [facility] Liaison [phone]

## 2024-05-31 NOTE — Progress Notes (Signed)
   05/31/24 0930  Spiritual Encounters  Type of Visit Initial  Care provided to: Family  Referral source Chaplain assessment  Reason for visit Routine spiritual support  OnCall Visit No  Spiritual Framework  Presenting Themes Goals in life/care;Coping tools;Significant life change;Impactful experiences and emotions (Chaplain sat with daughter in hallway as she shared her concerns)  Interventions  Spiritual Care Interventions Made Established relationship of care and support;Compassionate presence;Reflective listening;Normalization of emotions  Intervention Outcomes  Outcomes Connection to spiritual care;Awareness around self/spiritual resourses  Spiritual Care Plan  Spiritual Care Issues Still Outstanding No further spiritual care needs at this time (see row info)

## 2024-05-31 NOTE — Progress Notes (Signed)
 [facility]  ROUNDING NOTE   Subjective:  No acute events reported overnight. Patient ambulating well, on room air. Patient back to creatinine baseline and potassium 3.6. Patient acknowledges. No complaints to offer.   Objective:  Vital signs in last 24 hours:  Temp:  [96.6 F (35.9 C)-98.2 F (36.8 C)] 96.6 F (35.9 C) (06/21 0823) Pulse Rate:  [85-102] 97 (06/21 0823) Resp:  [13-23] 20 (06/21 0331) BP: (114-153)/(67-96) 114/68 (06/21 0823) SpO2:  [94 %-99 %] 99 % (06/21 0823) Weight:  [54.3 kg] 54.3 kg (06/21 0500)  Weight change: -0.6 kg Filed Weights   05/29/24 0500 05/30/24 0500 05/31/24 0500  Weight: 56.7 kg 54.9 kg 54.3 kg    Intake/Output: I/O last 3 completed shifts: In: -  Out: 1525 [Urine:1525]   Intake/Output this shift:  No intake/output data recorded.  Physical Exam: General: NAD,   Head: Normocephalic, atraumatic. Moist oral mucosal membranes  Eyes: Anicteric, PERRL  Neck: Supple, trachea midline  Lungs:  Clear to auscultation  Heart: Regular rate and rhythm  Abdomen:  Soft, nontender,   Extremities:  No peripheral edema.  Neurologic: Nonfocal, moving all four extremities  Skin: No lesions  Access: None    Basic Metabolic Panel: Recent Labs  Lab 05/27/24 0532 05/28/24 0601 05/28/24 1438 05/29/24 0311 05/30/24 0814 05/30/24 1355 05/31/24 0454  NA 138 138  --  136 136 137 133*  K 5.6* 6.0* 5.0 4.2 3.7 3.5 3.6  CL 110 109  --  108 106  --  105  CO2 20* 20*  --  18* 22  --  18*  GLUCOSE 123* 129*  --  122* 119*  --  122*  BUN 60* 58*  --  56* 56*  --  58*  CREATININE 2.91* 2.71*  --  2.41* 2.45*  --  2.43*  CALCIUM  8.2* 8.5*  --  8.4* 8.4*  --  8.0*  MG  --   --   --  1.9  --   --   --   PHOS  --   --   --  5.7*  --   --   --     Liver Function Tests: Recent Labs  Lab 05/28/24 0601 05/29/24 0311  AST 22  --   ALT 11  --   ALKPHOS 63  --   BILITOT 0.5  --   PROT 6.4*  --   ALBUMIN 2.9* 2.6*   No results for  input(s): LIPASE, AMYLASE in the last 168 hours. No results for input(s): AMMONIA in the last 168 hours.  CBC: Recent Labs  Lab 05/26/24 1414 05/26/24 2059 05/27/24 0532 05/30/24 1355  WBC 8.0 10.0 8.1  --   HGB 10.8* 9.9* 10.7* 11.2*  HCT 34.1* 31.3* 32.9* 33.0*  MCV 100.9* 101.0* 100.6*  --   PLT 291 273 284  --     Cardiac Enzymes: No results for input(s): CKTOTAL, CKMB, CKMBINDEX, TROPONINI in the last 168 hours.  BNP: Invalid input(s): POCBNP  CBG: No results for input(s): GLUCAP in the last 168 hours.  Microbiology: Results for orders placed or performed during the hospital encounter of 10/29/19  SARS CORONAVIRUS 2 (TAT 6-24 HRS) Nasopharyngeal Nasopharyngeal Swab     Status: None   Collection Time: 10/29/19  3:56 AM   Specimen: Nasopharyngeal Swab  Result Value Ref Range Status   SARS Coronavirus 2 NEGATIVE NEGATIVE Final    Comment: (NOTE) SARS-CoV-2 target nucleic acids are NOT DETECTED. The SARS-CoV-2 RNA is generally  detectable in upper and lower respiratory specimens during the acute phase of infection. Negative results do not preclude SARS-CoV-2 infection, do not rule out co-infections with other pathogens, and should not be used as the sole basis for treatment or other patient management decisions. Negative results must be combined with clinical observations, patient history, and epidemiological information. The expected result is Negative. Fact Sheet for Patients: HairSlick.no Fact Sheet for Healthcare Providers: quierodirigir.com This test is not yet approved or cleared by the [place]  FDA and  has been authorized for detection and/or diagnosis of SARS-CoV-2 by FDA under an Emergency Use Authorization (EUA). This EUA will remain  in effect (meaning this test can be used) for the duration of the COVID-19 declaration under Section 56 4(b)(1) of the Act, 21 U.S.C. section  360bbb-3(b)(1), unless the authorization is terminated or revoked sooner. Performed at [facility], [address] [place], [place] [address]     Coagulation Studies: No results for input(s): LABPROT, INR in the last 72 hours.  Urinalysis: No results for input(s): COLORURINE, LABSPEC, PHURINE, GLUCOSEU, HGBUR, BILIRUBINUR, KETONESUR, PROTEINUR, UROBILINOGEN, NITRITE, LEUKOCYTESUR in the last 72 hours.  Invalid input(s): APPERANCEUR    Imaging: CARDIAC CATHETERIZATION Result Date: 05/30/2024 HEMODYNAMICS: RA:   2 mmHg (mean) RV:   29/2 mmHg PA:   32/13 mmHg (20 mean) PCWP:  16 mmHg (mean)    Estimated Fick CO/CI: 3.3 L/min, 2.2 L/min/m2 Thermodilution CO/CI: 4 L/min, 2.6 L/min/m2    TPG    4  mmHg     PVR     ~1 Wood Units PAPi      >5  IMPRESSION: Normal to low right and left heart filling pressures Normal cardiac index by TD; mild to moderately reduced by Fick Normal PVR.     Medications:     aspirin  EC  81 mg Oral QODAY   heparin   5,000 Units Subcutaneous Q8H   hydrALAZINE   25 mg Oral Q8H   nicotine   21 mg Transdermal Daily   oxycodone   30 mg Oral 5 X Daily   patiromer   16.8 g Oral Daily   polyethylene glycol  17 g Oral Daily   sulfaSALAzine   500 mg Oral BID   traZODone   25 mg Oral Once   albuterol  Assessment/ Plan:  Mr. Victor Willis is a 68 y.o.  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 He also has chronic hyperkalemia.  He was taking lisinopril/HCTZ at home which can contribute to high potassium.  He denies drinking excessive orange juice but does eat potatoes/chips etc.  He stopped taking Lokelma  and Farxiga due to cost issues.  -Renal function improved.  Creatinine down to 2.4.  Potassium also down to 3.6.  Will discontinue Veltassa .  Continue Lasix  as per cardiology.  -Update: From renal perspective, patient stable and will follow up with our office in a week post discharge.  LOS: 5 Malyah Ohlrich P Levorn 6/21/202510:42 AM

## 2024-05-31 NOTE — Discharge Summary (Signed)
 Physician Discharge Summary  Victor Willis FMW:[id number] DOB: Feb 19, 1956 DOA: 05/26/2024  PCP: Pcp, No  Admit date: 05/26/2024  Discharge date: 05/31/2024  Admitted From:Home  Disposition:  Home  Recommendations for Outpatient Follow-up:  Follow up with PCP in 1-2 weeks Follow-up with cardiology outpatient with heart failure team Continue Lasix  20 mg daily as well as hydralazine  25 mg 3 times daily as prescribed Follow-up with nephrology in 1 week and repeat BMP, hold Veltassa  for now Continue other home medications as prior Patient to follow-up with outpatient palliative  Home Health: None  Equipment/Devices: None  Discharge Condition:Stable  CODE STATUS: Full  Diet recommendation: Heart Healthy/carb modified  Brief/Interim Summary: Victor Willis is a 68 y.o. male with a hx of coronary artery calcification noted on CT imaging, CKD stage IV, DM2, aortic atherosclerosis, HTN, prior heavy alcohol use with history of alcoholic pancreatitis, ongoing tobacco use, ankylosing spondylitis, macrocytic anemia, diverticulosis, and prior back and hip surgery came in after he was asked by orthopedic surgery to come to the emergency room for potassium 6.4.  He has been seen by nephrology for management of his potassium levels in the setting of AKI on CKD stage IV and was also noted to have significant volume overload with acute systolic CHF exacerbation in the setting of severe cardiomyopathy with EF 20%.  His potassium levels have now normalized without the use of Veltassa  and nephrology recommends follow-up outpatient and no further dosing of this medication at this time.  He has undergone right heart catheterization per heart failure team on 6/20 and recommendations are to remain on hydralazine  3 times daily at this time as well as Lasix  20 mg daily.  Patient is eager for discharge and palliative was planning to have further discussion with family members this afternoon regarding goals of  care, but at this time this will be set up outpatient.  He is at high risk for readmission and his future management is quite limited.  He will follow-up with cardiology as well as nephrology and palliative care as noted above and agrees to remain on medications as prescribed.  No other acute events or concerns noted.  Discharge Diagnoses:  Principal Problem:   Fluid overload Active Problems:   Essential hypertension   Alcohol abuse   Tobacco abuse   AKI (acute kidney injury) (HCC)   Lower extremity edema   Hyperkalemia   CKD (chronic kidney disease), stage IV (HCC)   Shortness of breath   Pleural effusion   Dilated cardiomyopathy (HCC)   Acute systolic heart failure (HCC)  Principal discharge diagnosis: Acute systolic CHF exacerbation along with AKI on CKD stage IV with hyperkalemia.  Discharge Instructions  Discharge Instructions     Diet - low sodium heart healthy   Complete by: As directed    Increase activity slowly   Complete by: As directed       Allergies as of 05/31/2024       Reactions   Celecoxib Rash, Anaphylaxis, Dermatitis   Atorvastatin Other (See Comments)   fatigue   Morphine  Other (See Comments)   PT STATE IT MAKE HIM FEEL WAY TOO LOOPY, CAN TOLERATE DILAUDID  VERY WELL     PT CAN ALSO TOLERATE HYDROCODONE VERY WELL   Prednisone Other (See Comments)   Throat swelling per patient        Medication List     STOP taking these medications    lisinopril-hydrochlorothiazide 20-12.5 MG tablet Commonly known as: ZESTORETIC   metoprolol  succinate 25 MG 24  hr tablet Commonly known as: Toprol  XL       TAKE these medications    aspirin  EC 81 MG tablet Take 81 mg by mouth every other day.   cyclobenzaprine 10 MG tablet Commonly known as: FLEXERIL Take 10 mg by mouth 2 (two) times daily.   furosemide  20 MG tablet Commonly known as: LASIX  Take 1 tablet (20 mg total) by mouth daily. Start taking on: June 01, 2024   hydrALAZINE  25 MG  tablet Commonly known as: APRESOLINE  Take 1 tablet (25 mg total) by mouth every 8 (eight) hours.   Multi-Vitamin tablet Take 1 tablet by mouth daily.   MULTIVITAMIN GUMMIES ADULT PO Take 2 each by mouth daily.   oxycodone  30 MG immediate release tablet Commonly known as: ROXICODONE  Take 30 mg by mouth 5 (five) times daily.   sulfaSALAzine  500 MG tablet Commonly known as: AZULFIDINE  Take 500 mg by mouth 2 (two) times daily.        Follow-up Information     [facility] [employer]. Go in 1 week(s).   Specialty: Nephrology Contact information: [address] [place] [address] [phone]         [facility]. Go to.   Specialty: Cardiology Contact information: [address] [place]  [address] [phone]               Allergies  Allergen Reactions   Celecoxib Rash, Anaphylaxis and Dermatitis   Atorvastatin Other (See Comments)    fatigue   Morphine  Other (See Comments)    PT STATE IT MAKE HIM FEEL WAY TOO LOOPY, CAN TOLERATE DILAUDID  VERY WELL     PT CAN ALSO TOLERATE HYDROCODONE VERY WELL   Prednisone Other (See Comments)    Throat swelling per patient    Consultations: Cardiology-heart failure team Nephrology Palliative care   Procedures/Studies: CARDIAC CATHETERIZATION Result Date: 05/30/2024 HEMODYNAMICS: RA:   2 mmHg (mean) RV:   29/2 mmHg PA:   32/13 mmHg (20 mean) PCWP:  16 mmHg (mean)    Estimated Fick CO/CI: 3.3 L/min, 2.2 L/min/m2 Thermodilution CO/CI: 4 L/min, 2.6 L/min/m2    TPG    4  mmHg     PVR     ~1 Wood Units PAPi      >5  IMPRESSION: Normal to low right and left heart filling pressures Normal cardiac index by TD; mild to moderately reduced by Fick Normal PVR.   ECHOCARDIOGRAM COMPLETE Result Date: 05/28/2024    ECHOCARDIOGRAM REPORT   Patient Name:   Victor Willis Date of Exam: 05/28/2024 Medical Rec #:  [id number]           Height:        60.0 in Accession #:    [id number]          Weight:       127.7 lb Date of Birth:  1956-03-24           BSA:          1.543 m Patient Age:    68 years            BP:           128/89 mmHg Patient Gender: M                   HR:           99 bpm. Exam Location:  [facility] Procedure: 2D Echo, Cardiac Doppler, Color Doppler, Strain Analysis  and 3D Echo            (Both Spectral and Color Flow Doppler were utilized during            procedure). Indications:     CHF-acute diastolic I50.31  History:         Patient has no prior history of Echocardiogram examinations.                  Risk Factors:Hypertension. CKD.  Sonographer:     Christopher Furnace Referring Phys:  [id number] SONA PATEL Diagnosing Phys: Evalene Lunger MD  Sonographer Comments: Global longitudinal strain was attempted. IMPRESSIONS  1. Left ventricular ejection fraction, by estimation, is <20%. Left ventricular ejection fraction by 3D volume is 11 %. Left ventricular ejection fraction by PLAX is 12 %. The left ventricle has severely decreased function. The left ventricle demonstrates global hypokinesis. The left ventricular internal cavity size was moderately dilated. Left ventricular diastolic parameters are consistent with Grade II diastolic dysfunction (pseudonormalization).  2. Right ventricular systolic function is normal. The right ventricular size is normal. There is moderately elevated pulmonary artery systolic pressure. The estimated right ventricular systolic pressure is 47.8 mmHg.  3. Left atrial size was moderately dilated.  4. The mitral valve is normal in structure. Moderate mitral valve regurgitation. No evidence of mitral stenosis.  5. Tricuspid valve regurgitation is mild to moderate.  6. The aortic valve is tricuspid. Aortic valve regurgitation is not visualized. No aortic stenosis is present.  7. The inferior vena cava is normal in size with greater than 50% respiratory variability, suggesting right atrial pressure of 3 mmHg. FINDINGS  Left Ventricle:  Left ventricular ejection fraction, by estimation, is <20%. Left ventricular ejection fraction by PLAX is 12 %. Left ventricular ejection fraction by 3D volume is 11 %. The left ventricle has severely decreased function. The left ventricle demonstrates global hypokinesis. The left ventricular internal cavity size was moderately dilated. There is no left ventricular hypertrophy. Left ventricular diastolic parameters are consistent with Grade II diastolic dysfunction (pseudonormalization). Right Ventricle: The right ventricular size is normal. No increase in right ventricular wall thickness. Right ventricular systolic function is normal. There is moderately elevated pulmonary artery systolic pressure. The tricuspid regurgitant velocity is 3.27 m/s, and with an assumed right atrial pressure of 5 mmHg, the estimated right ventricular systolic pressure is 47.8 mmHg. Left Atrium: Left atrial size was moderately dilated. Right Atrium: Right atrial size was normal in size. Pericardium: There is no evidence of pericardial effusion. Mitral Valve: The mitral valve is normal in structure. Moderate mitral valve regurgitation. No evidence of mitral valve stenosis. Tricuspid Valve: The tricuspid valve is normal in structure. Tricuspid valve regurgitation is mild to moderate. No evidence of tricuspid stenosis. Aortic Valve: The aortic valve is tricuspid. Aortic valve regurgitation is not visualized. No aortic stenosis is present. Aortic valve mean gradient measures 2.0 mmHg. Aortic valve peak gradient measures 4.5 mmHg. Aortic valve area, by VTI measures 1.59 cm. Pulmonic Valve: The pulmonic valve was normal in structure. Pulmonic valve regurgitation is not visualized. No evidence of pulmonic stenosis. Aorta: The aortic root is normal in size and structure. Venous: The inferior vena cava is normal in size with greater than 50% respiratory variability, suggesting right atrial pressure of 3 mmHg. IAS/Shunts: No atrial level shunt  detected by color flow Doppler. Additional Comments: 3D was performed not requiring image post processing on an independent workstation and was abnormal.  LEFT VENTRICLE PLAX 2D LV EF:  Left            Diastology                ventricular     LV e' medial:    3.15 cm/s                ejection        LV E/e' medial:  30.6                fraction by     LV e' lateral:   7.29 cm/s                PLAX is 12      LV E/e' lateral: 13.2                %. LVIDd:         5.70 cm         2D Longitudinal LVIDs:         5.40 cm         Strain LV PW:         1.00 cm         2D Strain GLS   -3.5 % LV IVS:        1.70 cm         Avg: LVOT diam:     2.30 cm LV SV:         24              3D Volume EF LV SV Index:   16              LV 3D EF:    Left LVOT Area:     4.15 cm                     ventricul                                             ar                                             ejection LV Volumes (MOD)                            fraction LV vol d, MOD    252.0 ml                   by 3D A4C:                                        volume is LV vol s, MOD    230.0 ml                   11 %. A4C: LV SV MOD A4C:   252.0 ml                                3D Volume EF:  3D EF:        11 % RIGHT VENTRICLE RV Basal diam:  3.40 cm RV Mid diam:    2.50 cm RV S prime:     11.40 cm/s TAPSE (M-mode): 1.5 cm LEFT ATRIUM              Index        RIGHT ATRIUM           Index LA diam:        5.10 cm  3.31 cm/m   RA Area:     19.10 cm LA Vol (A2C):   101.0 ml 65.48 ml/m  RA Volume:   50.70 ml  32.87 ml/m LA Vol (A4C):   74.0 ml  47.97 ml/m LA Biplane Vol: 92.1 ml  59.71 ml/m  AORTIC VALVE AV Area (Vmax):    1.63 cm AV Area (Vmean):   1.52 cm AV Area (VTI):     1.59 cm AV Vmax:           105.50 cm/s AV Vmean:          70.700 cm/s AV VTI:            0.152 m AV Peak Grad:      4.5 mmHg AV Mean Grad:      2.0 mmHg LVOT Vmax:         41.50 cm/s LVOT Vmean:        25.800 cm/s LVOT VTI:           0.058 m LVOT/AV VTI ratio: 0.38  AORTA Ao Root diam: 2.70 cm MITRAL VALVE                  TRICUSPID VALVE MV Area (PHT): 6.37 cm       TR Peak grad:   42.8 mmHg MV Decel Time: 119 msec       TR Vmax:        327.00 cm/s MR Peak grad:    83.9 mmHg MR Mean grad:    53.0 mmHg    SHUNTS MR Vmax:         458.00 cm/s  Systemic VTI:  0.06 m MR Vmean:        340.0 cm/s   Systemic Diam: 2.30 cm MR PISA:         3.08 cm MR PISA Eff ROA: 24 mm MR PISA Radius:  0.70 cm MV E velocity: 96.40 cm/s MV A velocity: 58.70 cm/s MV E/A ratio:  1.64 Evalene Lunger MD Electronically signed by Evalene Lunger MD Signature Date/Time: 05/28/2024/1:08:47 PM    Final    DG Chest 2 View Result Date: 05/26/2024 CLINICAL DATA:  Shortness of breath EXAM: CHEST - 2 VIEW COMPARISON:  None Available. FINDINGS: Underinflation. Small bilateral pleural effusions are seen, right-greater-than-left adjacent opacities. Enlarged cardiopericardial silhouette. Tortuous ectatic aorta. Interstitial prominence noted. No pneumothorax. Frontal view is rotated to the right. Osteopenia. Diffuse bridging syndesmophytes. Air-fluid level along the stomach beneath the left hemidiaphragm. IMPRESSION: Hyperinflation. Pleural effusions, right-greater-than-left. Enlarged heart. Electronically Signed   By: Ranell Bring M.D.   On: 05/26/2024 14:51     Discharge Exam: Vitals:   05/31/24 0331 05/31/24 0823  BP: 116/72 114/68  Pulse: 94 97  Resp: 20   Temp: 98.2 F (36.8 C) (!) 96.6 F (35.9 C)  SpO2: 96% 99%   Vitals:   05/31/24 0019 05/31/24 0331 05/31/24 0500 05/31/24 0823  BP: 117/67 116/72  114/68  Pulse: (!) 102 94  97  Resp: 20 20    Temp: 98.1 F (36.7 C) 98.2 F (36.8 C)  (!) 96.6 F (35.9 C)  TempSrc: Oral Oral    SpO2: 97% 96%  99%  Weight:   54.3 kg   Height:        General: Pt is alert, awake, not in acute distress Cardiovascular: RRR, S1/S2 +, no rubs, no gallops Respiratory: CTA bilaterally, no wheezing, no rhonchi Abdominal:  Soft, NT, ND, bowel sounds + Extremities: no edema, no cyanosis    The results of significant diagnostics from this hospitalization (including imaging, microbiology, ancillary and laboratory) are listed below for reference.     Microbiology: No results found for this or any previous visit (from the past 240 hours).   Labs: BNP (last 3 results) Recent Labs    05/26/24 1414  BNP 3,571.6*   Basic Metabolic Panel: Recent Labs  Lab 05/27/24 0532 05/28/24 0601 05/28/24 1438 05/29/24 0311 05/30/24 0814 05/30/24 1355 05/31/24 0454  NA 138 138  --  136 136 137 133*  K 5.6* 6.0* 5.0 4.2 3.7 3.5 3.6  CL 110 109  --  108 106  --  105  CO2 20* 20*  --  18* 22  --  18*  GLUCOSE 123* 129*  --  122* 119*  --  122*  BUN 60* 58*  --  56* 56*  --  58*  CREATININE 2.91* 2.71*  --  2.41* 2.45*  --  2.43*  CALCIUM  8.2* 8.5*  --  8.4* 8.4*  --  8.0*  MG  --   --   --  1.9  --   --   --   PHOS  --   --   --  5.7*  --   --   --    Liver Function Tests: Recent Labs  Lab 05/28/24 0601 05/29/24 0311  AST 22  --   ALT 11  --   ALKPHOS 63  --   BILITOT 0.5  --   PROT 6.4*  --   ALBUMIN 2.9* 2.6*   No results for input(s): LIPASE, AMYLASE in the last 168 hours. No results for input(s): AMMONIA in the last 168 hours. CBC: Recent Labs  Lab 05/26/24 1414 05/26/24 2059 05/27/24 0532 05/30/24 1355  WBC 8.0 10.0 8.1  --   HGB 10.8* 9.9* 10.7* 11.2*  HCT 34.1* 31.3* 32.9* 33.0*  MCV 100.9* 101.0* 100.6*  --   PLT 291 273 284  --    Cardiac Enzymes: No results for input(s): CKTOTAL, CKMB, CKMBINDEX, TROPONINI in the last 168 hours. BNP: Invalid input(s): POCBNP CBG: No results for input(s): GLUCAP in the last 168 hours. D-Dimer No results for input(s): DDIMER in the last 72 hours. Hgb A1c No results for input(s): HGBA1C in the last 72 hours. Lipid Profile No results for input(s): CHOL, HDL, LDLCALC, TRIG, CHOLHDL, LDLDIRECT in the last 72  hours. Thyroid function studies No results for input(s): TSH, T4TOTAL, T3FREE, THYROIDAB in the last 72 hours.  Invalid input(s): FREET3 Anemia work up No results for input(s): VITAMINB12, FOLATE, FERRITIN, TIBC, IRON, RETICCTPCT in the last 72 hours. Urinalysis    Component Value Date/Time   COLORURINE YELLOW (A) 10/28/2019 2317   APPEARANCEUR CLEAR (A) 10/28/2019 2317   LABSPEC 1.023 10/28/2019 2317   PHURINE 6.0 10/28/2019 2317   GLUCOSEU 50 (A) 10/28/2019 2317   HGBUR NEGATIVE 10/28/2019 2317   BILIRUBINUR NEGATIVE 10/28/2019 2317   KETONESUR 5 (A) 10/28/2019 2317   PROTEINUR >=300 (  A) 10/28/2019 2317   NITRITE NEGATIVE 10/28/2019 2317   LEUKOCYTESUR NEGATIVE 10/28/2019 2317   Sepsis Labs Recent Labs  Lab 05/26/24 1414 05/26/24 2059 05/27/24 0532  WBC 8.0 10.0 8.1   Microbiology No results found for this or any previous visit (from the past 240 hours).   Time coordinating discharge: 35 minutes  SIGNED:   Adron JONETTA Fairly, DO [facility] 05/31/2024, 11:22 AM  If 7PM-7AM, please contact night-coverage [email or web address]

## 2024-05-31 NOTE — Progress Notes (Signed)
 Daily Progress Note   Patient Name: Victor Willis       Date: 05/31/2024 DOB: January 26, 1956  Age: 68 y.o. MRN#: [id number] Attending Physician: Maree Adron BIRCH, DO Primary Care Physician: Freddrick, No Admit Date: 05/26/2024  Reason for Consultation/Follow-up: Establishing goals of care  HPI/Brief Hospital Review:  68 y.o. male  with past medical history of CKD stage IV, type 2 diabetes, aortic atherosclerosis, hypertension, prior heavy EtOH use with associated alcoholic pancreatitis, ongoing tobacco use, ankylosing spondylitis, diverticulosis, macrocytic anemia, previous back and hip surgery continues to be followed by orthopedic surgeon in [place]  admitted on 05/26/2024 with labs obtained from orthopedic surgeon and confirmed by local PCP with results revealing hyperkalemia-6.4.   Admitted and being treated for hyperkalemia, acute on chronic kidney disease stage IV, acute systolic congestive heart failure with severe cardiomyopathy-echo this admission revealing EF less than 20% Now being followed by advanced heart failure team S/p RHC today   Palliative medicine was consulted for assisting with goals of care conversations.  Subjective: Extensive chart review has been completed prior to meeting patient including labs, vital signs, imaging, progress notes, orders, and available advanced directive documents from current and previous encounters.    Secure chat received from primary team. Plan for discharge today, request to attempt to schedule family meeting earlier in the day prior to discharge.  Visited with Victor Willis at his bedside. Two sons at bedside during time of visit. Victor Willis shares he is being discharged today and requests to cancel our meeting. Offered to schedule meeting at an  earlier time but he declined. Sons at bedside offered to call family but Victor Willis again declined.  We discussed the importance of goals of care conversations in light of his recent cardiac findings and other underlying chronic comorbid conditions. Victor Willis shares he is not at a place at this time to discuss goals of care. Explained the role of outpatient palliative care services for which he agrees to an initial meeting. TOC order placed for outpatient palliative referral. Primary team and nursing staff updated.  Thank you for allowing the Palliative Medicine Team to assist in the care of this patient.  Total time:  25 minutes  Time spent includes: Detailed review of medical records (labs, imaging, vital signs), medically appropriate exam (mental status, respiratory, cardiac, skin), discussed with  treatment team, counseling and educating patient, family and staff, documenting clinical information, medication management and coordination of care.  Waddell Lesches, DNP, AGNP-C Palliative Medicine   Please contact Palliative Medicine Team phone at [phone] for questions and concerns.

## 2024-05-31 NOTE — Plan of Care (Signed)
  Problem: Clinical Measurements: Goal: Respiratory complications will improve Outcome: Progressing   Problem: Education: Goal: Knowledge of General Education information will improve Description: Including pain rating scale, medication(s)/side effects and non-pharmacologic comfort measures Outcome: Progressing

## 2024-06-02 ENCOUNTER — Encounter: Payer: Self-pay | Admitting: Cardiology

## 2024-06-02 LAB — POCT I-STAT EG7
Acid-base deficit: 4 mmol/L — ABNORMAL HIGH (ref 0.0–2.0)
Bicarbonate: 21 mmol/L (ref 20.0–28.0)
Calcium, Ion: 1.18 mmol/L (ref 1.15–1.40)
HCT: 34 % — ABNORMAL LOW (ref 39.0–52.0)
Hemoglobin: 11.6 g/dL — ABNORMAL LOW (ref 13.0–17.0)
O2 Saturation: 55 %
Potassium: 3.5 mmol/L (ref 3.5–5.1)
Sodium: 138 mmol/L (ref 135–145)
TCO2: 22 mmol/L (ref 22–32)
pCO2, Ven: 37.7 mmHg — ABNORMAL LOW (ref 44–60)
pH, Ven: 7.353 (ref 7.25–7.43)
pO2, Ven: 30 mmHg — CL (ref 32–45)

## 2024-06-03 ENCOUNTER — Telehealth: Payer: Self-pay | Admitting: Family

## 2024-06-03 NOTE — Telephone Encounter (Signed)
 Called to confirm/remind patient of their appointment at [facility] on 06/04/24.   Appointment:   [x] Confirmed  [] Left mess   [] No answer/No voice mail  [] VM Full/unable to leave message  [] Phone not in service  Patient reminded to bring all medications and/or complete list.  Confirmed patient has transportation. Gave directions, instructed to utilize valet parking.

## 2024-06-04 ENCOUNTER — Ambulatory Visit: Attending: Cardiology | Admitting: Cardiology

## 2024-06-04 VITALS — BP 137/79 | HR 91 | Wt <= 1120 oz

## 2024-06-04 DIAGNOSIS — I5021 Acute systolic (congestive) heart failure: Secondary | ICD-10-CM

## 2024-06-04 DIAGNOSIS — N184 Chronic kidney disease, stage 4 (severe): Secondary | ICD-10-CM | POA: Insufficient documentation

## 2024-06-04 DIAGNOSIS — I2510 Atherosclerotic heart disease of native coronary artery without angina pectoris: Secondary | ICD-10-CM | POA: Diagnosis not present

## 2024-06-04 DIAGNOSIS — F1010 Alcohol abuse, uncomplicated: Secondary | ICD-10-CM | POA: Insufficient documentation

## 2024-06-04 DIAGNOSIS — Z720 Tobacco use: Secondary | ICD-10-CM | POA: Diagnosis not present

## 2024-06-04 DIAGNOSIS — I130 Hypertensive heart and chronic kidney disease with heart failure and stage 1 through stage 4 chronic kidney disease, or unspecified chronic kidney disease: Secondary | ICD-10-CM | POA: Diagnosis not present

## 2024-06-04 DIAGNOSIS — I5022 Chronic systolic (congestive) heart failure: Secondary | ICD-10-CM | POA: Diagnosis present

## 2024-06-04 MED ORDER — NICOTINE 14 MG/24HR TD PT24: 14.0000 mg | MEDICATED_PATCH | Freq: Every day | TRANSDERMAL | 0 refills | Status: DC

## 2024-06-04 MED ORDER — FUROSEMIDE 20 MG PO TABS: 20.0000 mg | ORAL_TABLET | ORAL | Status: DC | PRN

## 2024-06-04 MED ORDER — CARVEDILOL 3.125 MG PO TABS: 3.1250 mg | ORAL_TABLET | Freq: Two times a day (BID) | ORAL | 3 refills | Status: DC

## 2024-06-04 MED ORDER — ATORVASTATIN CALCIUM 40 MG PO TABS: 40.0000 mg | ORAL_TABLET | Freq: Every day | ORAL | 3 refills | Status: AC

## 2024-06-04 NOTE — Progress Notes (Unsigned)
 Cardiology Office Note    Date:  06/05/2024   ID:  Victor Willis, DOB October 27, 1956, MRN [id number]  PCP:  Pcp, No  Cardiologist:  Lonni Hanson, MD  Electrophysiologist:  None   Chief Complaint: Hospital follow-up  History of Present Illness:   Victor Willis is a 68 y.o. male with history of coronary artery calcification noted on CT on imaging, CKD stage IV, type 2 diabetes, aortic atherosclerosis, hypertension, prior heavy alcohol use with history of alcoholic pancreatitis, ongoing tobacco use, ankylosing spondylitis, macrocytic anemia, diverticulosis, and prior back and hip surgery following right who presents for hospital follow-up.  Patient presented to [facility] ED 05/26/2024 with initial complaints of shortness of breath and lower extremity swelling.  He reported recently being found to have progressive renal dysfunction through routine lab work at his PCPs office with creatinine 2.3 potassium 6.4.  He was directed to decrease his lisinopril and start hydrochlorothiazide.  After this adjustment he started to notice progressive shortness of breath and lower extremity swelling with generalized malaise and fatigue.  Chest x-ray revealed hyperinflation with right greater than left pleural effusions.  Creatinine was 2.66, BUN 51, and potassium 5.3.  Troponin was mildly elevated and flat trending, not consistent with ACS.  Not a good candidate for ischemic evaluation in the setting of renal dysfunction.  Echo done and showed severely reduced LV function less than 20% with dilated LV.  He was aggressively diuresed with IV Lasix .  Advanced heart failure was consulted and patient underwent right heart cath 6/28 which showed low filling pressures and cardiac index of 2.5 L/min/m.  Patient refused MRI. 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 He was deemed not a candidate for advanced therapies due to severe lung and kidney disease.  Achieved euvolemic state and was discharged on  05/31/2024.  Patient reports to clinic today doing overall well from a cardiac perspective.  His daughter is present for visit today.  He reports that symptoms are greatly improved since discharge from the hospital.  He reports that he has been able to get around the house and take care of himself since returning home.  He denies chest pain, shortness of breath, lower extremity swelling, and orthopnea.  His cath site is stable with some mild bruising.  He has had recent follow-up with nephrology and advanced heart failure.  Labs independently reviewed: 06/03/2024-Hgb 10.7, HCT 34.5, platelets 267, BUN 70, creatinine 2.6, sodium 135, potassium 5.2 05/21/2024-TC 167, TG 146, HDL 43, LDL 94  Objective   Past Medical History:  Diagnosis Date   Alcohol abuse    Ankylosing spondylitis (HCC)    Aortic atherosclerosis (HCC)    Chronic kidney disease (CKD), stage IV (severe) (HCC)    Coronary artery calcification    Hypertension    Macrocytic anemia     Current Medications: Current Meds  Medication Sig   aspirin  81 MG chewable tablet Chew 81 mg by mouth daily.   atorvastatin (LIPITOR) 40 MG tablet Take 1 tablet (40 mg total) by mouth daily.   carvedilol (COREG) 3.125 MG tablet Take 1 tablet (3.125 mg total) by mouth 2 (two) times daily.   cyclobenzaprine (FLEXERIL) 10 MG tablet Take 10 mg by mouth 2 (two) times daily.   furosemide  (LASIX ) 20 MG tablet Take 1 tablet (20 mg total) by mouth as needed.   hydrALAZINE  (APRESOLINE ) 25 MG tablet Take 1 tablet (25 mg total) by mouth every 8 (eight) hours.   Multiple Vitamin (MULTI-VITAMIN) tablet Take 1  tablet by mouth daily.   Multiple Vitamins-Minerals (MULTIVITAMIN GUMMIES ADULT PO) Take 2 each by mouth daily.   nicotine  (NICODERM CQ  - DOSED IN MG/24 HOURS) 14 mg/24hr patch Place 1 patch (14 mg total) onto the skin daily.   oxycodone  (ROXICODONE ) 30 MG immediate release tablet Take 30 mg by mouth 5 (five) times daily.   sulfaSALAzine  (AZULFIDINE ) 500  MG tablet Take 500 mg by mouth 2 (two) times daily.    Allergies:   Celecoxib, Atorvastatin, Morphine , and Prednisone   Social History   Socioeconomic History   Marital status: Single    Spouse name: Not on file   Number of children: Not on file   Years of education: Not on file   Highest education level: Not on file  Occupational History   Not on file  Tobacco Use   Smoking status: Every Day   Smokeless tobacco: Never  Substance and Sexual Activity   Alcohol use: Yes    Comment: daily, heavy   Drug use: Not on file   Sexual activity: Not on file  Other Topics Concern   Not on file  Social History Narrative   Not on file   Social Drivers of Health   Financial Resource Strain: Patient Declined (04/09/2023)   Received from [facility]   Overall Financial Resource Strain (CARDIA)    Difficulty of Paying Living Expenses: Patient declined  Food Insecurity: No Food Insecurity (05/26/2024)   Hunger Vital Sign    Worried About Running Out of Food in the Last Year: Never true    Ran Out of Food in the Last Year: Never true  Transportation Needs: No Transportation Needs (05/26/2024)   PRAPARE - Administrator, Civil Service (Medical): No    Lack of Transportation (Non-Medical): No  Physical Activity: Not on file  Stress: Not on file  Social Connections: Socially Isolated (05/26/2024)   Social Connection and Isolation Panel    Frequency of Communication with Friends and Family: Once a week    Frequency of Social Gatherings with Friends and Family: Once a week    Attends Religious Services: 1 to 4 times per year    Active Member of [employer] or Organizations: Patient declined    Attends Banker Meetings: Never    Marital Status: Divorced     Family History:  The patient's family history includes Ankylosing spondylitis in his daughter; Diabetes Mellitus II in his father and mother.  ROS:   12-point review of systems is negative unless  otherwise noted in the HPI.  EKGs/Other Studies Reviewed:    Studies reviewed were summarized above. The additional studies were reviewed today: 05/30/2024 RHC Normal to low right and left heart filling pressures Normal cardiac index by TD; mild to moderately reduced by Fick  Normal PVR  05/28/2024 Echo complete 1. Left ventricular ejection fraction, by estimation, is <20%. Left  ventricular ejection fraction by 3D volume is 11 %. Left ventricular  ejection fraction by PLAX is 12 %. The left ventricle has severely  decreased function. The left ventricle  demonstrates global hypokinesis. The left ventricular internal cavity size  was moderately dilated. Left ventricular diastolic parameters are  consistent with Grade II diastolic dysfunction (pseudonormalization).   2. Right ventricular systolic function is normal. The right ventricular  size is normal. There is moderately elevated pulmonary artery systolic  pressure. The estimated right ventricular systolic pressure is 47.8 mmHg.   3. Left atrial size was moderately dilated.  4. The mitral valve is normal in structure. Moderate mitral valve  regurgitation. No evidence of mitral stenosis.   5. Tricuspid valve regurgitation is mild to moderate.   6. The aortic valve is tricuspid. Aortic valve regurgitation is not  visualized. No aortic stenosis is present.   7. The inferior vena cava is normal in size with greater than 50%  respiratory variability, suggesting right atrial pressure of 3 mmHg.   EKG:  EKG personally reviewed by me today EKG Interpretation Date/Time:  Thursday June 05 2024 10:05:31 EDT Ventricular Rate:  97 PR Interval:  138 QRS Duration:  100 QT Interval:  360 QTC Calculation: 457 R Axis:   -22  Text Interpretation: Normal sinus rhythm Left ventricular hypertrophy with repolarization abnormality ( R in aVL , Sokolow-Lyon , Cornell product ) Cannot rule out Septal infarct , age undetermined When compared with ECG of  26-May-2024 14:13, No significant change was found Confirmed by Lorene Sinclair ([id number]) on 06/05/2024 10:07:54 AM  PHYSICAL EXAM:    VS:  BP (!) 124/54   Pulse 97   Ht 5' (1.524 m)   Wt 124 lb 12.8 oz (56.6 kg)   SpO2 98%   BMI 24.37 kg/m   BMI: Body mass index is 24.37 kg/m.  Physical Exam Vitals and nursing note reviewed.  Constitutional:      Appearance: Normal appearance.   Cardiovascular:     Rate and Rhythm: Normal rate and regular rhythm.     Heart sounds: No murmur heard.    No gallop.  Pulmonary:     Effort: Pulmonary effort is normal.     Breath sounds: Normal breath sounds. No wheezing or rales.   Musculoskeletal:     Right lower leg: No edema.     Left lower leg: No edema.   Skin:    General: Skin is warm and dry.   Neurological:     General: No focal deficit present.     Mental Status: He is alert and oriented to person, place, and time. Mental status is at baseline.   Psychiatric:        Mood and Affect: Mood normal.        Behavior: Behavior normal.     Wt Readings from Last 3 Encounters:  06/05/24 124 lb 12.8 oz (56.6 kg)  06/04/24 15 lb (6.804 kg)  05/31/24 119 lb 11.4 oz (54.3 kg)        ASSESSMENT & PLAN:   Systolic heart failure - Recent hospitalization detailed above with echo 06/04/2024 revealing EF < 20% and RV function mild to moderately reduced. RHC 6/28 showed low filling pressures and cardiac index of 2.5 L/min/m.  Suspected ETOH cardiomyopathy but also high risk for underlying CAD.  Seen by advanced heart failure yesterday.  Currently denies dyspnea and lower extremity swelling. Appears euvolemic on exam. Continue carvedilol 3.125 mg twice daily and furosemide  20 mg daily as needed.  Coronary artery calcification Myocardial injury - Recent hospitalization detailed above with flat trending of troponins, not consistent with ACS.  He is without symptoms of angina and cardiac decompensation.  Given lack of symptoms, will defer ischemic  evaluation at this time in the setting of advanced kidney disease.  If patient develops symptoms in the future, could consider Lexiscan Myoview.  Start aspirin  81 mg daily.  Continue atorvastatin 40 mg daily.  Hypertension - Blood pressure well-controlled.  Continue hydralazine  25 mg every 8 hours.  Hyperkalemia - K on discharge 6/21 was 3.6. He had labs  with nephrology on 6/24 with K 5.2. Repeat BMP in 1 week.  He will have this done through his PCP. May need to restart potassium binders.  Avoid ACEi/ARB/ARNi/MRA. Management per nephrology.   CKD IV - Creatinine 2.6 on 6/24.  Continue to avoid nephrotoxic agents.  Management per nephrology.  Alcohol and tobacco abuse - Patient reports abstaining from alcohol and tobacco use since discharge from the hospital.  He is using nicotine  patches and nicotine  gum.  Encouraged continued abstinence.   Disposition: F/u with Dr. Mady or an APP in 4 months.   Medication Adjustments/Labs and Tests Ordered: Current medicines are reviewed at length with the patient today.  Concerns regarding medicines are outlined above. Medication changes, Labs and Tests ordered today are summarized above and listed in the Patient Instructions accessible in Encounters.   Signed, Lesley Maffucci, PA-C 06/05/2024 4:36 PM     [facility] [place] [address] [place], [place] [address] [phone]

## 2024-06-04 NOTE — Patient Instructions (Signed)
 Medication Changes:  STOP Asprin  START Carvedilol 3.125 mg Twice daily  START Atorvastatin 40 mg daily.  START Nicotine  patch 14 mg every 24 hours    Follow-Up in: 2 months   At the [facility], you and your health needs are our priority. We have a designated team specialized in the treatment of Heart Failure. This Care Team includes your primary Heart Failure Specialized Cardiologist (physician), Advanced Practice Providers (APPs- Physician Assistants and Nurse Practitioners), and Pharmacist who all work together to provide you with the care you need, when you need it.   You may see any of the following providers on your designated Care Team at your next follow up:  Dr. Toribio Fuel Dr. Ezra Shuck Dr. Ria Commander Dr. Odis Brownie Ellouise Class, FNP Jaun Bash, RPH-CPP  Please be sure to bring in all your medications bottles to every appointment.   Need to Contact Us :  If you have any questions or concerns before your next appointment please send us  a message through [place] or call our office at [phone].    TO LEAVE A MESSAGE FOR THE NURSE SELECT OPTION 2, PLEASE LEAVE A MESSAGE INCLUDING: YOUR NAME DATE OF BIRTH CALL BACK NUMBER REASON FOR CALL**this is important as we prioritize the call backs  YOU WILL RECEIVE A CALL BACK THE SAME DAY AS LONG AS YOU CALL BEFORE 4:00 PM

## 2024-06-05 ENCOUNTER — Ambulatory Visit: Attending: Cardiology | Admitting: Physician Assistant

## 2024-06-05 ENCOUNTER — Encounter: Payer: Self-pay | Admitting: Cardiology

## 2024-06-05 VITALS — BP 124/54 | HR 97 | Ht 60.0 in | Wt 124.8 lb

## 2024-06-05 DIAGNOSIS — N184 Chronic kidney disease, stage 4 (severe): Secondary | ICD-10-CM | POA: Insufficient documentation

## 2024-06-05 DIAGNOSIS — I5020 Unspecified systolic (congestive) heart failure: Secondary | ICD-10-CM | POA: Diagnosis present

## 2024-06-05 DIAGNOSIS — I10 Essential (primary) hypertension: Secondary | ICD-10-CM | POA: Diagnosis present

## 2024-06-05 DIAGNOSIS — F1010 Alcohol abuse, uncomplicated: Secondary | ICD-10-CM | POA: Diagnosis present

## 2024-06-05 DIAGNOSIS — Z720 Tobacco use: Secondary | ICD-10-CM | POA: Insufficient documentation

## 2024-06-05 DIAGNOSIS — E875 Hyperkalemia: Secondary | ICD-10-CM | POA: Diagnosis present

## 2024-06-05 NOTE — Patient Instructions (Signed)
 Medication Instructions:  Your physician recommends the following medication changes.  START TAKING: Aspirin  81 mg daily  *If you need a refill on your cardiac medications before your next appointment, please call your pharmacy*  Lab Work: No labs ordered today  If you have labs (blood work) drawn today and your tests are completely normal, you will receive your results only by: MyChart Message (if you have MyChart) OR A paper copy in the mail If you have any lab test that is abnormal or we need to change your treatment, we will call you to review the results.  Testing/Procedures: No test ordered today   Follow-Up: At [facility], you and your health needs are our priority.  As part of our continuing mission to provide you with exceptional heart care, our providers are all part of one team.  This team includes your primary Cardiologist (physician) and Advanced Practice Providers or APPs (Physician Assistants and Nurse Practitioners) who all work together to provide you with the care you need, when you need it.  Your next appointment:   4 month(s)  Provider:   Lonni Hanson, MD or Lesley Maffucci, PA-C

## 2024-06-09 ENCOUNTER — Other Ambulatory Visit: Payer: Self-pay

## 2024-06-09 ENCOUNTER — Emergency Department
Admission: EM | Admit: 2024-06-09 | Discharge: 2024-06-10 | Disposition: A | Discharge status: Home / Self Care | Attending: Emergency Medicine | Admitting: Emergency Medicine

## 2024-06-09 ENCOUNTER — Emergency Department

## 2024-06-09 DIAGNOSIS — R000 Tachycardia, unspecified: Secondary | ICD-10-CM | POA: Diagnosis present

## 2024-06-09 DIAGNOSIS — E875 Hyperkalemia: Secondary | ICD-10-CM | POA: Diagnosis not present

## 2024-06-09 DIAGNOSIS — R030 Elevated blood-pressure reading, without diagnosis of hypertension: Secondary | ICD-10-CM

## 2024-06-09 DIAGNOSIS — F17200 Nicotine dependence, unspecified, uncomplicated: Secondary | ICD-10-CM | POA: Diagnosis not present

## 2024-06-09 DIAGNOSIS — I130 Hypertensive heart and chronic kidney disease with heart failure and stage 1 through stage 4 chronic kidney disease, or unspecified chronic kidney disease: Secondary | ICD-10-CM | POA: Diagnosis not present

## 2024-06-09 DIAGNOSIS — Z7982 Long term (current) use of aspirin: Secondary | ICD-10-CM | POA: Insufficient documentation

## 2024-06-09 DIAGNOSIS — D649 Anemia, unspecified: Secondary | ICD-10-CM | POA: Diagnosis not present

## 2024-06-09 DIAGNOSIS — Z79899 Other long term (current) drug therapy: Secondary | ICD-10-CM | POA: Insufficient documentation

## 2024-06-09 DIAGNOSIS — I509 Heart failure, unspecified: Secondary | ICD-10-CM | POA: Insufficient documentation

## 2024-06-09 DIAGNOSIS — N184 Chronic kidney disease, stage 4 (severe): Secondary | ICD-10-CM | POA: Insufficient documentation

## 2024-06-09 DIAGNOSIS — Z96643 Presence of artificial hip joint, bilateral: Secondary | ICD-10-CM | POA: Insufficient documentation

## 2024-06-09 LAB — CBC WITH DIFFERENTIAL/PLATELET
Abs Immature Granulocytes: 0.03 10*3/uL (ref 0.00–0.07)
Basophils Absolute: 0.1 10*3/uL (ref 0.0–0.1)
Basophils Relative: 1 %
Eosinophils Absolute: 0.1 10*3/uL (ref 0.0–0.5)
Eosinophils Relative: 1 %
HCT: 26.8 % — ABNORMAL LOW (ref 39.0–52.0)
Hemoglobin: 8.5 g/dL — ABNORMAL LOW (ref 13.0–17.0)
Immature Granulocytes: 0 %
Lymphocytes Relative: 6 %
Lymphs Abs: 0.5 10*3/uL — ABNORMAL LOW (ref 0.7–4.0)
MCH: 31.6 pg (ref 26.0–34.0)
MCHC: 31.7 g/dL (ref 30.0–36.0)
MCV: 99.6 fL (ref 80.0–100.0)
Monocytes Absolute: 0.5 10*3/uL (ref 0.1–1.0)
Monocytes Relative: 7 %
Neutro Abs: 6.7 10*3/uL (ref 1.7–7.7)
Neutrophils Relative %: 85 %
Platelets: 221 10*3/uL (ref 150–400)
RBC: 2.69 MIL/uL — ABNORMAL LOW (ref 4.22–5.81)
RDW: 14 % (ref 11.5–15.5)
WBC: 7.9 10*3/uL (ref 4.0–10.5)
nRBC: 0 % (ref 0.0–0.2)

## 2024-06-09 NOTE — ED Provider Notes (Signed)
 [facility] Provider Note    Event Date/Time   First MD Initiated Contact with Patient 06/09/24 2311     (approximate)   History   Tachycardia   HPI  Victor Willis is a 68 y.o. male brought to the ED via EMS from home with a chief complaint of tachycardia and elevated blood pressure.  Patient reports she was hospitalized 2 weeks ago for CHF exacerbation, taken off his beta-blocker and another agent and instead replace with Hydralazine  which she thinks is causing his symptoms.  Denies associated chest pain, shortness of breath, abdominal pain, nausea, vomiting or dizziness.  At the time of our interview and examination all symptoms have resolved and patient is eager for discharge home.     Past Medical History   Past Medical History:  Diagnosis Date   Alcohol abuse    Ankylosing spondylitis (HCC)    Aortic atherosclerosis (HCC)    Chronic kidney disease (CKD), stage IV (severe) (HCC)    Coronary artery calcification    Hypertension    Macrocytic anemia      Active Problem List   Patient Active Problem List   Diagnosis Date Noted   Shortness of breath 05/28/2024   Pleural effusion 05/28/2024   Dilated cardiomyopathy (HCC) 05/28/2024   Acute systolic heart failure (HCC) 05/28/2024   Lower extremity edema 05/27/2024   Hyperkalemia 05/27/2024   CKD (chronic kidney disease), stage IV (HCC) 05/27/2024   Fluid overload 05/26/2024   AKI (acute kidney injury) (HCC) 05/26/2024   Obesity (BMI 30-39.9) 10/31/2019   Tobacco abuse 10/31/2019   Ankylosing spondylitis (HCC) 10/29/2019   Essential hypertension 10/29/2019   Acute pancreatitis 10/29/2019   Alcohol abuse 10/29/2019   Hypertensive urgency 10/29/2019     Past Surgical History   Past Surgical History:  Procedure Laterality Date   BACK SURGERY     RIGHT HEART CATH N/A 05/30/2024   Procedure: RIGHT HEART CATH;  Surgeon: Gardenia Led, DO;  Location: [facility] INVASIVE CV LAB;   Service: Cardiovascular;  Laterality: N/A;   TOTAL HIP ARTHROPLASTY Bilateral      Home Medications   Prior to Admission medications   Medication Sig Start Date End Date Taking? Authorizing Provider  aspirin  81 MG chewable tablet Chew 81 mg by mouth daily.    [provider]  atorvastatin  (LIPITOR) 40 MG tablet Take 1 tablet (40 mg total) by mouth daily. 06/04/24   Zenaida Morene PARAS, MD  carvedilol  (COREG ) 3.125 MG tablet Take 1 tablet (3.125 mg total) by mouth 2 (two) times daily. 06/04/24 09/02/24  Zenaida Morene PARAS, MD  cyclobenzaprine (FLEXERIL) 10 MG tablet Take 10 mg by mouth 2 (two) times daily. 09/19/19   [provider]  furosemide  (LASIX ) 20 MG tablet Take 1 tablet (20 mg total) by mouth as needed. 06/04/24   Zenaida Morene PARAS, MD  hydrALAZINE  (APRESOLINE ) 25 MG tablet Take 1 tablet (25 mg total) by mouth every 8 (eight) hours. 05/31/24 06/30/24  Maree Bracken D, DO  Multiple Vitamin (MULTI-VITAMIN) tablet Take 1 tablet by mouth daily.    [provider]  Multiple Vitamins-Minerals (MULTIVITAMIN GUMMIES ADULT PO) Take 2 each by mouth daily.    [provider]  nicotine  (NICODERM CQ  - DOSED IN MG/24 HOURS) 14 mg/24hr patch Place 1 patch (14 mg total) onto the skin daily. 06/04/24   Zenaida Morene PARAS, MD  oxycodone  (ROXICODONE ) 30 MG immediate release tablet Take 30 mg by mouth 5 (five) times daily. 10/23/19  [provider]  sulfaSALAzine  (AZULFIDINE ) 500 MG tablet Take 500 mg by mouth 2 (two) times daily. 09/19/19   [provider]     Allergies  Celecoxib, Atorvastatin , Morphine , and Prednisone   Family History   Family History  Problem Relation Age of Onset   Diabetes Mellitus II Mother    Diabetes Mellitus II Father    Ankylosing spondylitis Daughter      Physical Exam  Triage Vital Signs: ED Triage Vitals  Encounter Vitals Group     BP 06/09/24 2252 (!) 127/90     Girls Systolic BP Percentile --      Girls  Diastolic BP Percentile --      Boys Systolic BP Percentile --      Boys Diastolic BP Percentile --      Pulse Rate 06/09/24 2252 (!) 102     Resp --      Temp 06/09/24 2252 97.9 F (36.6 C)     Temp Source 06/09/24 2252 Oral     SpO2 06/09/24 2252 98 %     Weight 06/09/24 2254 122 lb (55.3 kg)     Height 06/09/24 2254 5' (1.524 m)     Head Circumference --      Peak Flow --      Pain Score 06/09/24 2253 0     Pain Loc --      Pain Education --      Exclude from Growth Chart --     Updated Vital Signs: BP 114/73   Pulse 84   Temp 97.9 F (36.6 C) (Oral)   Resp 14   Ht 5' (1.524 m)   Wt 55.3 kg   SpO2 95%   BMI 23.83 kg/m    General: Awake, no distress.  CV:  RRR.  Good peripheral perfusion.  Resp:  Normal effort.  CTAB. Abd:  Nontender.  No distention.  Other:  No thyromegaly.  No overt pedal edema.   ED Results / Procedures / Treatments  Labs (all labs ordered are listed, but only abnormal results are displayed) Labs Reviewed  CBC WITH DIFFERENTIAL/PLATELET - Abnormal; Notable for the following components:      Result Value   RBC 2.69 (*)    Hemoglobin 8.5 (*)    HCT 26.8 (*)    Lymphs Abs 0.5 (*)    All other components within normal limits  COMPREHENSIVE METABOLIC PANEL WITH GFR - Abnormal; Notable for the following components:   Potassium 5.2 (*)    CO2 17 (*)    Glucose, Bld 123 (*)    BUN 58 (*)    Creatinine, Ser 2.20 (*)    Calcium  8.0 (*)    Total Protein 5.6 (*)    Albumin 2.6 (*)    GFR, Estimated 32 (*)    All other components within normal limits  BRAIN NATRIURETIC PEPTIDE - Abnormal; Notable for the following components:   B Natriuretic Peptide 2,216.9 (*)    All other components within normal limits  TSH - Abnormal; Notable for the following components:   TSH 4.810 (*)    All other components within normal limits  TROPONIN I (HIGH SENSITIVITY) - Abnormal; Notable for the following components:   Troponin I (High Sensitivity) 63 (*)     All other components within normal limits  TROPONIN I (HIGH SENSITIVITY) - Abnormal; Notable for the following components:   Troponin I (High Sensitivity) 63 (*)    All other components within normal limits  EKG  ED ECG REPORT I, Kimarie Coor J, the attending physician, personally viewed and interpreted this ECG.   Date: 06/10/2024  EKG Time: 2253  Rate: 133  Rhythm: sinus tachycardia  Axis: Normal  Intervals:none  ST&T Change: Nonspecific    RADIOLOGY I have independently visualized and interpreted patient's imaging study as well as noted the radiology interpretation:  Chest x-ray: Improved patchy right basilar airspace disease  Official radiology report(s): DG Chest Port 1 View Result Date: 06/09/2024 CLINICAL DATA:  Tachycardia EXAM: PORTABLE CHEST 1 VIEW COMPARISON:  05/26/2024 FINDINGS: Cardiac shadow is enlarged but stable. Aortic calcifications are seen. Patient rotation to the left is noted. Patchy right basilar airspace opacity is noted but improved when compared with the prior exam. No acute bony abnormality is noted. Previously seen effusions have resolved. IMPRESSION: None patchy airspace opacity in the right base but improved when compared with the prior exam. Electronically Signed   By: Oneil Devonshire M.D.   On: 06/09/2024 23:38     PROCEDURES:  Critical Care performed: No  .1-3 Lead EKG Interpretation  Performed by: Robinette Vermell PARAS, MD Authorized by: Robinette Vermell PARAS, MD     Interpretation: normal     ECG rate:  98   ECG rate assessment: normal     Rhythm: sinus rhythm     Ectopy: none     Conduction: normal   Comments:     Patient placed on cardiac monitor to evaluate for arrhythmia    MEDICATIONS ORDERED IN ED: Medications - No data to display   IMPRESSION / MDM / ASSESSMENT AND PLAN / ED COURSE  I reviewed the triage vital signs and the nursing notes.                             68 year old male presenting with tachycardia and elevated blood  pressure. Differential diagnosis includes, but is not limited to, ACS, aortic dissection, pulmonary embolism, cardiac tamponade, pneumothorax, pneumonia, pericarditis, myocarditis, GI-related causes including esophagitis/gastritis, and musculoskeletal chest wall pain.   I personally reviewed patient's records and note his hospitalization 05/26/2024 for CHF exacerbation followed by nephrology office visit on 06/03/2024 followed by cardiology office visit on 06/05/2024 followed by PCP office visit on 06/05/2024.  Patient's presentation is most consistent with acute complicated illness / injury requiring diagnostic workup.  The patient is on the cardiac monitor to evaluate for evidence of arrhythmia and/or significant heart rate changes.  Patient is currently asymptomatic and desires to go home.  After discussion with patient and family member, he is agreeable to stay for evaluation given his recent hospitalization.  Will obtain cardiac panel, add TSH and BMP and reassess.  Clinical Course as of 06/10/24 0540  Tue Jun 10, 2024  0029 Laboratory results demonstrates hemoglobin 8.5 which is slightly lower than prior, improved creatinine 2.2, mild hyperkalemia with potassium 5.2 without EKG changes.  Troponin is stable at 63 compared to prior, BNP improved from prior.  Chest x-ray improved from prior.  Voice is no complaints at this time.  Will draw repeat troponin. [JS]  0127 Stable troponin.  Heart rate and blood pressure normalized.  Patient voices no complaints at this time.  Has close follow-up with his PCP scheduled.  Strict return precautions given.  Patient and family member verbalized understanding and agree with plan of care. [JS]    Clinical Course User Index [JS] Robinette Vermell PARAS, MD     FINAL CLINICAL IMPRESSION(S) / ED  DIAGNOSES   Final diagnoses:  Tachycardia  Elevated blood pressure reading     Rx / DC Orders   ED Discharge Orders     None        Note:  This document was prepared  using Dragon voice recognition software and may include unintentional dictation errors.   Prescilla Monger J, MD 06/10/24 [phone]

## 2024-06-09 NOTE — Progress Notes (Unsigned)
   ADVANCED HEART FAILURE NEW PATIENT CLINIC NOTE  Referring Physician: No ref. provider found  Primary Care: Pcp, No Primary Cardiologist:  HPI: Victor Willis is a 68 y.o. male with a PMH of CKD IV, ankylosing spondylitis, hypertension, recent admission for acute systolic heart faliure who presents for initial visit for further evaluation and treatment of heart failure/cardiomyopathy.      Patient presented to [facility] ED 05/26/2024 with initial complaints of shortness of breath and lower extremity swelling.  Echo done and showed severely reduced LV function less than 20% with dilated LV.  He was aggressively diuresed with IV Lasix .  Advanced heart failure was consulted and patient underwent right heart cath 6/28 which showed low filling pressures and cardiac index of 2.5 L/min/m.   Achieved euvolemic state and was discharged on 05/31/2024.      SUBJECTIVE:  Patient overall states that he is doing fairly well. He denies any worsening lower extermity swelling since discharge from the hospital and is feeling much better than he did in the prior to admission. We discussed his diagnosis, the role of GDMT, and the reasoning for holding off on ischemic evaluation at this time. Heart failure questions answered.   PMH, current medications, allergies, social history, and family history reviewed in epic.  PHYSICAL EXAM: Vitals:   06/04/24 1413  BP: 137/79  Pulse: 91  SpO2: 100%   GENERAL: NAD, stiff neck and body due to AS PULM:  Normal work of breathing, clear to auscultation bilaterally. Respirations are unlabored.  CARDIAC:  JVP: flat         Normal rate with regular rhythm. No murmurs, rubs or gallops.  Trace edema. Warm and well perfused extremities. ABDOMEN: Soft, non-tender, non-distended. NEUROLOGIC: Patient is oriented x3 with no focal or lateralizing neurologic deficits.    ASSESSMENT & PLAN:  Chronic systolic heart failure Recently diagnosed, etiology includes alcohol abuse  or CAD, though unable to tolerate MRI with his AS condition. Tolerating GDMT well, has substantially improved. Appears well compensated currently. - Start carvedilol  3.125mg  BID - Continue hydralazine  25mg  TID - No RAASi/MRA with CKD - Consider SGLT2 at next visit - Lasix  20mg  as needed given euvolemia - May eventually require coronary assessment but will delay given improving symptoms and CKD  Hypertension: - Better controlled at home - Management as above  CAD - CAC noted on CT scan, AS is a risk factor - Start atorvastatin  40mg  daily - Can stop daily aspirin   CKD IV - Follow up with nephrology, SGLT2 at next visit  Alcohol and tobacco abuse - No alcohol since admission, working on tobacco cessation  Follow up in 2 months  Morene Brownie, MD Advanced Heart Failure Mechanical Circulatory Support 06/09/24

## 2024-06-09 NOTE — ED Triage Notes (Addendum)
 Pt arrives via [facility] from home with C/O tachycardia (170'S) discharged from the hospital about a week ago with changes to home medications; currently off of beta blockers  Pt stated he took hydralazine  and two baby aspirin  before calling EMS

## 2024-06-10 LAB — COMPREHENSIVE METABOLIC PANEL WITH GFR
ALT: 11 U/L (ref 0–44)
AST: 21 U/L (ref 15–41)
Albumin: 2.6 g/dL — ABNORMAL LOW (ref 3.5–5.0)
Alkaline Phosphatase: 45 U/L (ref 38–126)
Anion gap: 8 (ref 5–15)
BUN: 58 mg/dL — ABNORMAL HIGH (ref 8–23)
CO2: 17 mmol/L — ABNORMAL LOW (ref 22–32)
Calcium: 8 mg/dL — ABNORMAL LOW (ref 8.9–10.3)
Chloride: 111 mmol/L (ref 98–111)
Creatinine, Ser: 2.2 mg/dL — ABNORMAL HIGH (ref 0.61–1.24)
GFR, Estimated: 32 mL/min — ABNORMAL LOW (ref >60)
Glucose, Bld: 123 mg/dL — ABNORMAL HIGH (ref 70–99)
Potassium: 5.2 mmol/L — ABNORMAL HIGH (ref 3.5–5.1)
Sodium: 136 mmol/L (ref 135–145)
Total Bilirubin: 0.5 mg/dL (ref 0.0–1.2)
Total Protein: 5.6 g/dL — ABNORMAL LOW (ref 6.5–8.1)

## 2024-06-10 LAB — BRAIN NATRIURETIC PEPTIDE: B Natriuretic Peptide: 2216.9 pg/mL — ABNORMAL HIGH (ref 0.0–100.0)

## 2024-06-10 LAB — TROPONIN I (HIGH SENSITIVITY)
Troponin I (High Sensitivity): 63 ng/L — ABNORMAL HIGH (ref <18)
Troponin I (High Sensitivity): 63 ng/L — ABNORMAL HIGH (ref <18)

## 2024-06-10 LAB — TSH: TSH: 4.81 u[IU]/mL — ABNORMAL HIGH (ref 0.350–4.500)

## 2024-06-10 NOTE — Discharge Instructions (Signed)
 Continue to take all medications as directed by your doctor.  Return to the ER for worsening symptoms, persistent vomiting, difficulty breathing or other concerns.

## 2024-06-24 ENCOUNTER — Ambulatory Visit: Admitting: Cardiology

## 2024-07-14 ENCOUNTER — Telehealth: Payer: Self-pay | Admitting: Family

## 2024-07-14 ENCOUNTER — Encounter: Payer: Self-pay | Admitting: Internal Medicine

## 2024-07-14 NOTE — Telephone Encounter (Signed)
 Pt scheduled with Ellouise Class, NP on 8/5.

## 2024-07-14 NOTE — Telephone Encounter (Signed)
 Called to confirm/remind patient of their appointment at the Advanced Heart Failure Clinic on 07/15/24.   Appointment:   [x] Confirmed  [] Left mess   [] No answer/No voice mail  [] VM Full/unable to leave message  [] Phone not in service  Patient reminded to bring all medications and/or complete list.  Confirmed patient has transportation. Gave directions, instructed to utilize valet parking.

## 2024-07-14 NOTE — Telephone Encounter (Signed)
 Patient called into office to advise that he is having swelling (feet and ankles) and SOB x 2 days - denies dizziness, lightheadedness and any other S&S,  patient states that Dr. Diedra had changed his medication from Lasix  20 mg BID to 40 mg daily at last office visit 7/31  Advised if S&S worsen to GO TO ed, PATIENT VERBALIZED AGREEMENT   Please advise any treatment changes or office visit

## 2024-07-14 NOTE — Progress Notes (Unsigned)
   ADVANCED HEART FAILURE NEW PATIENT CLINIC NOTE  Referring Physician: No ref. provider found  Primary Care: Diedra Lame, MD Primary Cardiologist: Mady Bruckner, MD/ Lorene Sinclair, [place]  Chief Complaint:   HPI:  Victor Willis is a 68 y.o. male with a PMH of CKD IV, ankylosing spondylitis, hypertension, recent admission for acute systolic heart faliure who presents for initial visit for further evaluation and treatment of heart failure/cardiomyopathy.    Patient presented to [facility] ED 05/26/2024 with initial complaints of shortness of breath and lower extremity swelling.  Echo done and showed severely reduced LV function less than 20% with dilated LV.  He was aggressively diuresed with IV Lasix . Advanced heart failure was consulted and patient underwent right heart cath 6/28 which showed low filling pressures and cardiac index of 2.5 L/min/m.  Achieved euvolemic state and was discharged on 05/31/2024.   Seen in [facility] 06/25 where carvedilol  3.125mg  BID & atorvastatin  40mg  were started. ASA stopped.    Was in the ED 06/09/24 due to tachycardia and elevated blood pressure. Was concerned that recent med changes caused this. Symptoms resolved by the time EDP saw patient. Hemoglobin slightly lower, renal function slightly better. Troponins stable. CXR and BNP improved.   SUBJECTIVE:  He presents today for an acute HF visit with a chief complaint of   PMH, current medications, allergies, social history, and family history reviewed in epic.  ROS: All systems negative except what is listed in HPI, PMH and Problem List    PHYSICAL EXAM:  GENERAL: NAD, stiff neck and body due to AS PULM:  Normal work of breathing, clear to auscultation bilaterally. Respirations are unlabored.  CARDIAC:  JVP: flat         Normal rate with regular rhythm. No murmurs, rubs or gallops.  Trace edema. Warm and well perfused extremities. ABDOMEN: Soft, non-tender, non-distended. NEUROLOGIC: Patient is  oriented x3 with no focal or lateralizing neurologic deficits.   EKG:      ASSESSMENT & PLAN:  Chronic systolic heart failure Recently diagnosed, etiology includes alcohol abuse or CAD, though unable to tolerate MRI with his AS condition.  - NYHA class - euvolemic - weight 124? pounds from last visit here 6 weeks ago - Continue carvedilol  3.125mg  BID - Continue hydralazine  25mg  TID - No RAASi/MRA with CKD - Consider SGLT2 at next visit - Lasix  20mg  as needed given euvolemia - May eventually require coronary assessment but will delay given improving symptoms and CKD  Hypertension: - BP - saw PCP Violette) 07/25 - BMET 07/10/24 reviewed: sodium 136, potassium 5.3, creatinine 2.2 & GFR 32  CAD - CAC noted on CT scan, AS is a risk factor - Continue atorvastatin  40mg  daily - saw cardiology Concepcion) 06/25  CKD IV - To see nephrology [name]) 08/25 - SGLT2 at next visit  Alcohol and tobacco abuse - No alcohol since admission, working on tobacco cessation   Ellouise Class, FNP-C 07/14/24

## 2024-07-14 NOTE — Telephone Encounter (Signed)
 Error

## 2024-07-14 NOTE — Telephone Encounter (Signed)
 I recommend that he be seen in the heart failure clinic in the next day or two for evaluation.  If symptoms worsen in the meantime, he should go to the ED for further evaluation.  Lonni Hanson, MD [facility]

## 2024-07-14 NOTE — Telephone Encounter (Signed)
 Pt c/o swelling/edema: STAT if pt has developed SOB within 24 hours  If swelling, where is the swelling located? Ankles   How much weight have you gained and in what time span? Not sure   Have you gained 2 pounds in a day or 5 pounds in a week? Not sure   Do you have a log of your daily weights (if so, list)? No   Are you currently taking a fluid pill?  Yes   Are you currently SOB? He states he is a little bit   Have you traveled recently in a car or plane for an extended period of time? No

## 2024-07-15 ENCOUNTER — Encounter: Payer: Self-pay | Admitting: Family

## 2024-07-15 ENCOUNTER — Other Ambulatory Visit (HOSPITAL_COMMUNITY): Payer: Self-pay

## 2024-07-15 ENCOUNTER — Ambulatory Visit: Attending: Family | Admitting: Family

## 2024-07-15 VITALS — BP 124/71 | HR 84 | Wt 132.0 lb

## 2024-07-15 DIAGNOSIS — I5020 Unspecified systolic (congestive) heart failure: Secondary | ICD-10-CM

## 2024-07-15 DIAGNOSIS — I2510 Atherosclerotic heart disease of native coronary artery without angina pectoris: Secondary | ICD-10-CM

## 2024-07-15 DIAGNOSIS — I5022 Chronic systolic (congestive) heart failure: Secondary | ICD-10-CM | POA: Diagnosis present

## 2024-07-15 DIAGNOSIS — M459 Ankylosing spondylitis of unspecified sites in spine: Secondary | ICD-10-CM | POA: Insufficient documentation

## 2024-07-15 DIAGNOSIS — I10 Essential (primary) hypertension: Secondary | ICD-10-CM | POA: Diagnosis not present

## 2024-07-15 DIAGNOSIS — Z720 Tobacco use: Secondary | ICD-10-CM

## 2024-07-15 DIAGNOSIS — I130 Hypertensive heart and chronic kidney disease with heart failure and stage 1 through stage 4 chronic kidney disease, or unspecified chronic kidney disease: Secondary | ICD-10-CM | POA: Insufficient documentation

## 2024-07-15 DIAGNOSIS — F17290 Nicotine dependence, other tobacco product, uncomplicated: Secondary | ICD-10-CM | POA: Insufficient documentation

## 2024-07-15 DIAGNOSIS — N184 Chronic kidney disease, stage 4 (severe): Secondary | ICD-10-CM | POA: Diagnosis not present

## 2024-07-15 MED ORDER — JARDIANCE 10 MG PO TABS: 10.0000 mg | ORAL_TABLET | Freq: Every day | ORAL | 5 refills | Status: AC

## 2024-07-15 MED ORDER — FUROSEMIDE 20 MG PO TABS: 40.0000 mg | ORAL_TABLET | Freq: Every day | ORAL | Status: DC

## 2024-07-15 MED ORDER — EMPAGLIFLOZIN 10 MG PO TABS: 10.0000 mg | ORAL_TABLET | Freq: Every day | ORAL | 3 refills | Status: DC

## 2024-07-15 MED ORDER — FUROSEMIDE 40 MG PO TABS: 40.0000 mg | ORAL_TABLET | Freq: Every day | ORAL | Status: DC

## 2024-07-15 NOTE — Progress Notes (Signed)
 ReDS Vest / Clip - 07/15/24 1000       ReDS Vest / Clip   Station Marker A    Ruler Value 30    ReDS Value Range High volume overload    ReDS Actual Value 41

## 2024-07-15 NOTE — Patient Instructions (Addendum)
 Medication Changes:  START JARDIANCE  10 MG ONCE DAILY  INCREASE LASIX  TO 80 MG ONCE DAILY FOR 2 DAYS ONLY  THEN, DECREASE LASIX  TO 40 MG ONCE DAILY   Special Instructions // Education:  DO NOT USE SALT.  WEAR TEDS SOCKS DAILY AND REMOVE IN THE EVENING  Follow-Up on: FRIDAY   Thank you for choosing [facility] Advanced Heart Failure Clinic.    At the Advanced Heart Failure Clinic, you and your health needs are our priority. We have a designated team specialized in the treatment of Heart Failure. This Care Team includes your primary Heart Failure Specialized Cardiologist (physician), Advanced Practice Providers (APPs- Physician Assistants and Nurse Practitioners), and Pharmacist who all work together to provide you with the care you need, when you need it.   You may see any of the following providers on your designated Care Team at your next follow up:  Dr. Toribio Fuel Dr. Ezra Shuck Dr. Ria Commander Dr. Morene Brownie Ellouise Class, FNP Jaun Bash, RPH-CPP  Please be sure to bring in all your medications bottles to every appointment.   Need to Contact Us :  If you have any questions or concerns before your next appointment please send us  a message through [place] or call our office at [phone].    TO LEAVE A MESSAGE FOR THE NURSE SELECT OPTION 2, PLEASE LEAVE A MESSAGE INCLUDING: YOUR NAME DATE OF BIRTH CALL BACK NUMBER REASON FOR CALL**this is important as we prioritize the call backs  YOU WILL RECEIVE A CALL BACK THE SAME DAY AS LONG AS YOU CALL BEFORE 4:00 PM

## 2024-07-17 ENCOUNTER — Telehealth: Payer: Self-pay | Admitting: Family

## 2024-07-17 NOTE — Progress Notes (Unsigned)
 ADVANCED HEART FAILURE NEW PATIENT CLINIC NOTE  Referring Physician: Diedra Lame, MD  Primary Care: Diedra Lame, MD Primary Cardiologist: End, Lonni, MD/ Lorene Sinclair, [place]  Chief Complaint: shortness of breath   HPI:  Victor Willis is a 68 y.o. male with a PMH of CKD IV, ankylosing spondylitis, hypertension, recent admission for acute systolic heart faliure who presents for initial visit for further evaluation and treatment of heart failure/cardiomyopathy.    Patient presented to [facility] ED 05/26/2024 with initial complaints of shortness of breath and lower extremity swelling.  Echo done and showed severely reduced LV function less than 20% with dilated LV.  He was aggressively diuresed with IV Lasix . Advanced heart failure was consulted and patient underwent right heart cath 6/28 which showed low filling pressures and cardiac index of 2.5 L/min/m.  Achieved euvolemic state and was discharged on 05/31/2024.   Seen in [facility] 06/25 where carvedilol  3.125mg  BID & atorvastatin  40mg  were started. ASA stopped.    Was in the ED 06/09/24 due to tachycardia and elevated blood pressure. Was concerned that recent med changes caused this. Symptoms resolved by the time EDP saw patient. Hemoglobin slightly lower, renal function slightly better. Troponins stable. CXR and BNP improved.   SUBJECTIVE:  He presents today for an acute HF visit, with his daughter, with a chief complaint of shortness of breath (worsening). Has associated fatigue, pedal edema (worsening). Feels like he's sleeping ok although does endorse orthopnea. Denies chest pain, palpitations, dizziness or abdominal distention. Has worn compression socks in the past with good results.   When discussing diet, he says that he loves salt and does use it frequently. Does eat higher sodium foods for convenience. When I added up his daily fluid intake, it ranges from 76-92 ounces.   PMH, current medications, allergies, social  history, and family history reviewed in epic.  ROS: All systems negative except what is listed in HPI, PMH and Problem List   PHYSICAL EXAM:  General: Well appearing. Stiff neck due to ankylosing spondylitis Cor: Elevated JVD. Regular rhythm, rate.  Lungs: clear Abdomen: soft, nontender, nondistended. Extremities: 2+ pitting edema bilateral lower legs up to knees Neuro:. Affect pleasant   EKG: not done  ReDs reading: 41 %, abnormal (see plan below)   ASSESSMENT & PLAN:  Chronic systolic heart failure Recently diagnosed, etiology includes alcohol abuse or CAD, though unable to tolerate MRI with his AS condition.  - NYHA class III - ReDs today is 41% - fluid up with elevated ReDs, weight gain and worsening symptoms - weight up 8 pounds from last visit here 6 weeks ago - Continue carvedilol  12.5mg  BID - Continue furosemide  40mg  daily. Will increase to 80mg  daily X 2 days and then decrease it back to 40mg  daily. BMET later this week - Continue hydralazine  25mg  TID - begin jardiance  10mg  daily. 30 day voucher. Pharm will see if he's eligible for a grant for assistance with jardiance  - No RAASi/MRA with CKD - wear compression socks daily with removal at bedtime - discussed the importance of not adding salt and not to use NoSalt due to already elevated potassium - reviewed the importance of keeping daily fluid intake to no more than 60 ounces, less if nephrology recommends. Explained the rationale of not drinking too much fluids (estimating that he's currently drinking 76-92 ounces daily) - May eventually require coronary assessment but will delay given improving symptoms and CKD  Hypertension: - BP 124/70 - saw PCP Violette) 07/25 - BMET 07/10/24 reviewed:  sodium 136, potassium 5.3, creatinine 2.2 & GFR 32 - repeat BMET later this week  CAD - CAD noted on CT scan, AS is a risk factor - Continue atorvastatin  40mg  daily - saw cardiology Concepcion) 06/25  CKD IV - To see  nephrology [name]) 08/25 (next week) - begin jardiance  10mg  daily  Alcohol and tobacco abuse - No alcohol since admission 06/25 - No smoking since admission 06/25. Does continue to use nicotine  patches   Return in 3 days, sooner if needed.   Ellouise Class, FNP-C 07/17/24

## 2024-07-17 NOTE — Telephone Encounter (Signed)
 Called to confirm/remind patient of their appointment at the Advanced Heart Failure Clinic on 07/18/24.   Appointment:   [x] Confirmed  [] Left mess   [] No answer/No voice mail  [] VM Full/unable to leave message  [] Phone not in service  Patient reminded to bring all medications and/or complete list.  Confirmed patient has transportation. Gave directions, instructed to utilize valet parking.

## 2024-07-18 ENCOUNTER — Ambulatory Visit: Admitting: Family

## 2024-07-18 ENCOUNTER — Telehealth: Payer: Self-pay | Admitting: Student

## 2024-07-18 ENCOUNTER — Other Ambulatory Visit (HOSPITAL_COMMUNITY): Payer: Self-pay

## 2024-07-18 ENCOUNTER — Telehealth: Payer: Self-pay

## 2024-07-18 ENCOUNTER — Other Ambulatory Visit
Admission: RE | Admit: 2024-07-18 | Discharge: 2024-07-18 | Disposition: A | Discharge status: Home / Self Care | Source: Ambulatory Visit | Attending: Family | Admitting: Family

## 2024-07-18 ENCOUNTER — Encounter: Payer: Self-pay | Admitting: Family

## 2024-07-18 ENCOUNTER — Ambulatory Visit: Payer: Self-pay | Admitting: Family

## 2024-07-18 VITALS — BP 126/75 | HR 119 | Wt 127.0 lb

## 2024-07-18 DIAGNOSIS — I2510 Atherosclerotic heart disease of native coronary artery without angina pectoris: Secondary | ICD-10-CM | POA: Diagnosis not present

## 2024-07-18 DIAGNOSIS — R0602 Shortness of breath: Secondary | ICD-10-CM | POA: Insufficient documentation

## 2024-07-18 DIAGNOSIS — I5022 Chronic systolic (congestive) heart failure: Secondary | ICD-10-CM

## 2024-07-18 DIAGNOSIS — N184 Chronic kidney disease, stage 4 (severe): Secondary | ICD-10-CM

## 2024-07-18 DIAGNOSIS — I10 Essential (primary) hypertension: Secondary | ICD-10-CM | POA: Diagnosis not present

## 2024-07-18 DIAGNOSIS — Z720 Tobacco use: Secondary | ICD-10-CM

## 2024-07-18 LAB — BRAIN NATRIURETIC PEPTIDE: B Natriuretic Peptide: >4500 pg/mL — ABNORMAL HIGH (ref 0.0–100.0)

## 2024-07-18 LAB — BASIC METABOLIC PANEL WITH GFR
Anion gap: 12 (ref 5–15)
BUN: 70 mg/dL — ABNORMAL HIGH (ref 8–23)
CO2: 23 mmol/L (ref 22–32)
Calcium: 8.6 mg/dL — ABNORMAL LOW (ref 8.9–10.3)
Chloride: 103 mmol/L (ref 98–111)
Creatinine, Ser: 2.84 mg/dL — ABNORMAL HIGH (ref 0.61–1.24)
GFR, Estimated: 24 mL/min — ABNORMAL LOW (ref >60)
Glucose, Bld: 221 mg/dL — ABNORMAL HIGH (ref 70–99)
Potassium: 5 mmol/L (ref 3.5–5.1)
Sodium: 138 mmol/L (ref 135–145)

## 2024-07-18 MED ORDER — METOLAZONE 2.5 MG PO TABS: 2.5000 mg | ORAL_TABLET | ORAL | 0 refills | Status: DC

## 2024-07-18 NOTE — Patient Instructions (Signed)
 Medication Changes:  No medication changes.   Lab Work:  Go over to the [facility]ICAL [facility]ALL. Go pass the gift shop and have your blood work completed.  We will only call you if the results are abnormal or if the provider would like to make medication changes.  No news is good news.   Thank you for choosing [facility] Advanced Heart Failure [facility].    At the Advanced Heart Failure Clinic, you and your health needs are our priority. We have a designated team specialized in the treatment of Heart Failure. This Care Team includes your primary Heart Failure Specialized Cardiologist (physician), Advanced Practice Providers (APPs- Physician Assistants and Nurse Practitioners), and Pharmacist who all work together to provide you with the care you need, when you need it.   You may see any of the following providers on your designated Care Team at your next follow up:  Dr. Toribio Fuel Dr. Ezra Shuck Dr. Ria Commander Dr. Morene Brownie Ellouise Class, FNP Jaun Bash, RPH-CPP  Please be sure to bring in all your medications bottles to every appointment.   Need to Contact Us :  If you have any questions or concerns before your next appointment please send us  a message through [place] or call our office at [phone].    TO LEAVE A MESSAGE FOR THE NURSE SELECT OPTION 2, PLEASE LEAVE A MESSAGE INCLUDING: YOUR NAME DATE OF BIRTH CALL BACK NUMBER REASON FOR CALL**this is important as we prioritize the call backs  YOU WILL RECEIVE A CALL BACK THE SAME DAY AS LONG AS YOU CALL BEFORE 4:00 PM

## 2024-07-18 NOTE — Telephone Encounter (Signed)
 Advanced Heart Failure Patient Advocate Encounter  Application for Jardiance  e-faxed to [facility] on 07/18/2024. Application form attached to patient chart.  Rachel DEL, CPhT Rx Patient Advocate Phone: [phone]

## 2024-07-18 NOTE — Telephone Encounter (Addendum)
   Patient called after hours line with concerns about shortness of breath.  Called and spoke with patient.  He was seen by Ellouise Class NP, at the [facility] CHF clinic earlier today noted to be volume overloaded with NYA class III symptoms.  ReDs is 40%.  BNP was >4,500 today. Creatinine was 2.84 (baseline around 2.2 to 2.4). She recommend continuing Lasix  20 mg daily and added Metolazone  2.5 every other day for 2 doses.  Patient states he initially felt fine after visit was able to go home and nap.  However, now he notes increasing shortness of breath.  He states this is how he felt last night when he almost called EMS.  He does not think that he can sleep like this.  He does not sound short of breath over the phone. However, recommended he go to the ED if he if he feels that short of breath given markedly elevated BNP and elevated volume status on exam today.  He voiced understanding and agreed.  Janet Humphreys E Takia Runyon, PA-C 07/18/2024 6:57 PM

## 2024-07-18 NOTE — Progress Notes (Signed)
 ReDS Vest / Clip - 07/18/24 1200       ReDS Vest / Clip   Station Marker A    Ruler Value 30    ReDS Value Range High volume overload    ReDS Actual Value 40

## 2024-07-21 NOTE — Telephone Encounter (Signed)
 [employer] automated system states application is being processed. Will continue to follow up.

## 2024-07-28 NOTE — Telephone Encounter (Signed)
 Patient was approved to receive Jardiance  from [facility] Effective 07/24/2024 to 07/24/2025

## 2024-08-04 ENCOUNTER — Telehealth: Payer: Self-pay | Admitting: Family

## 2024-08-04 NOTE — Telephone Encounter (Signed)
 Called to confirm/remind patient of their appointment at the Advanced Heart Failure Clinic on 08/05/24.   Appointment:   [] Confirmed  [x] Left mess   [] No answer/No voice mail  [] VM Full/unable to leave message  [] Phone not in service  Patient reminded to bring all medications and/or complete list.  Confirmed patient has transportation. Gave directions, instructed to utilize valet parking.

## 2024-08-05 ENCOUNTER — Ambulatory Visit: Attending: Internal Medicine | Admitting: Internal Medicine

## 2024-08-05 ENCOUNTER — Encounter: Payer: Self-pay | Admitting: Internal Medicine

## 2024-08-05 ENCOUNTER — Inpatient Hospital Stay (HOSPITAL_COMMUNITY)
Admission: RE | Admit: 2024-08-05 | Discharge: 2024-08-05 | Disposition: A | Discharge status: Home / Self Care | Source: Ambulatory Visit | Attending: Internal Medicine | Admitting: Internal Medicine

## 2024-08-05 ENCOUNTER — Other Ambulatory Visit (HOSPITAL_COMMUNITY): Payer: Self-pay | Admitting: Internal Medicine

## 2024-08-05 VITALS — BP 122/63 | HR 90 | Wt 123.2 lb

## 2024-08-05 DIAGNOSIS — I5022 Chronic systolic (congestive) heart failure: Secondary | ICD-10-CM | POA: Insufficient documentation

## 2024-08-05 DIAGNOSIS — Z79899 Other long term (current) drug therapy: Secondary | ICD-10-CM | POA: Diagnosis not present

## 2024-08-05 DIAGNOSIS — N184 Chronic kidney disease, stage 4 (severe): Secondary | ICD-10-CM | POA: Insufficient documentation

## 2024-08-05 DIAGNOSIS — Z7984 Long term (current) use of oral hypoglycemic drugs: Secondary | ICD-10-CM | POA: Insufficient documentation

## 2024-08-05 DIAGNOSIS — I10 Essential (primary) hypertension: Secondary | ICD-10-CM | POA: Diagnosis not present

## 2024-08-05 DIAGNOSIS — M459 Ankylosing spondylitis of unspecified sites in spine: Secondary | ICD-10-CM | POA: Insufficient documentation

## 2024-08-05 DIAGNOSIS — Z720 Tobacco use: Secondary | ICD-10-CM | POA: Diagnosis not present

## 2024-08-05 DIAGNOSIS — I130 Hypertensive heart and chronic kidney disease with heart failure and stage 1 through stage 4 chronic kidney disease, or unspecified chronic kidney disease: Secondary | ICD-10-CM | POA: Insufficient documentation

## 2024-08-05 DIAGNOSIS — R002 Palpitations: Secondary | ICD-10-CM

## 2024-08-05 DIAGNOSIS — I2510 Atherosclerotic heart disease of native coronary artery without angina pectoris: Secondary | ICD-10-CM | POA: Insufficient documentation

## 2024-08-05 DIAGNOSIS — Z87891 Personal history of nicotine dependence: Secondary | ICD-10-CM | POA: Insufficient documentation

## 2024-08-05 DIAGNOSIS — I429 Cardiomyopathy, unspecified: Secondary | ICD-10-CM | POA: Diagnosis not present

## 2024-08-05 NOTE — Patient Instructions (Signed)
 Medication Changes:  No medication changes today!    Testing/Procedures:  Your physician has requested that you have an echocardiogram. Echocardiography is a painless test that uses sound waves to create images of your heart. It provides your doctor with information about the size and shape of your heart and how well your heart's chambers and valves are working. This procedure takes approximately one hour. There are no restrictions for this procedure. Please do NOT wear cologne, perfume, aftershave, or lotions (deodorant is allowed). Please arrive 15 minutes prior to your appointment time.  Please note: We ask at that you not bring children with you during ultrasound (echo/ vascular) testing. Due to room size and safety concerns, children are not allowed in the ultrasound rooms during exams. Our front office staff cannot provide observation of children in our lobby area while testing is being conducted. An adult accompanying a patient to their appointment will only be allowed in the ultrasound room at the discretion of the ultrasound technician under special circumstances. We apologize for any inconvenience.  Someone will contact you with in order to schedule your appointment.  Referrals:  You have been referred to [facility] cardiac rehab. They will contact you in order to schedule your appointments.  Special Instructions // Education:  Your provider has recommended that  you wear a Zio Patch for 14 days.  This monitor will record your heart rhythm for our review.  IF you have any symptoms while wearing the monitor please press the button.  If you have any issues with the patch or you notice a red or orange light on it please call the company at [phone].  Once you remove the patch please mail it back to the company as soon as possible so we can get the results.   Follow-Up in: Please follow up with the Advanced Heart Failure Clinic in 2 months with Ellouise Class, FNP.   Thank you for  choosing [facility] Advanced Heart Failure Clinic.    At the Advanced Heart Failure Clinic, you and your health needs are our priority. We have a designated team specialized in the treatment of Heart Failure. This Care Team includes your primary Heart Failure Specialized Cardiologist (physician), Advanced Practice Providers (APPs- Physician Assistants and Nurse Practitioners), and Pharmacist who all work together to provide you with the care you need, when you need it.   You may see any of the following providers on your designated Care Team at your next follow up:  Dr. Toribio Fuel Dr. Ezra Shuck Dr. Ria Commander Dr. Morene Brownie Ellouise Class, FNP Jaun Bash, RPH-CPP  Please be sure to bring in all your medications bottles to every appointment.   Need to Contact Us :  If you have any questions or concerns before your next appointment please send us  a message through [place] or call our office at [phone].    TO LEAVE A MESSAGE FOR THE NURSE SELECT OPTION 2, PLEASE LEAVE A MESSAGE INCLUDING: YOUR NAME DATE OF BIRTH CALL BACK NUMBER REASON FOR CALL**this is important as we prioritize the call backs  YOU WILL RECEIVE A CALL BACK THE SAME DAY AS LONG AS YOU CALL BEFORE 4:00 PM

## 2024-08-05 NOTE — Progress Notes (Signed)
 ADVANCED HEART FAILURE NEW PATIENT CLINIC NOTE   Referring Physician: No ref. provider found  Primary Care: Diedra Lame, MD Primary Cardiologist: Mady Bruckner, MD/ Lorene Sinclair, [place]  Chief Complaint: shortness of breath   HPI:  Victor Willis is a 68 y.o. male with a PMH of CKD IV, ankylosing spondylitis, hypertension, recent admission for acute systolic heart faliure who presents for initial visit for further evaluation and treatment of heart failure/cardiomyopathy.    Patient presented to [facility] ED 05/26/2024 with initial complaints of shortness of breath and lower extremity swelling.  Echo done and showed severely reduced LV function less than 20% with dilated LV.  He was aggressively diuresed with IV Lasix . Advanced heart failure was consulted and patient underwent right heart cath 6/28 which showed low filling pressures and cardiac index of 2.5 L/min/m.  Achieved euvolemic state and was discharged on 05/31/2024.   Seen in [facility] 06/25 where carvedilol  3.125mg  BID & atorvastatin  40mg  were started. ASA stopped.    Was in the ED 06/09/24 due to tachycardia and elevated blood pressure. Was concerned that recent med changes caused this. Symptoms resolved by the time EDP saw patient. Hemoglobin slightly lower, renal function slightly better. Troponins stable. CXR and BNP improved.   SUBJECTIVE:  He presents today for a HF follow-up visit with [name]. Says he is beginning to feel better. Less fatigue. (Was sleeping all the time before). Able to do ADLs. Breathing ok. Swelling getting under better control (has been in HF 2x with volume overload since d/c)  No CP. Taking lasix  40 daily. About 5 days ago had a 2 hour episode of tachypalpitations and HTN. Resolved spontaneously.    PMH, current medications, allergies, social history, and family history reviewed in epic.  ROS: All systems negative except what is listed in HPI, PMH and Problem List   PHYSICAL  EXAM:  General: Well appearing. Stiff neck due to ankylosing spondylitis Cor: Elevated JVD. Regular rhythm, tachycardic.  Lungs: clear Abdomen: soft, nontender, nondistended. Extremities: trace pitting edema right lower leg, 1+ pitting left lower leg Neuro:. Affect pleasant, anxious  Vitals:   08/05/24 1020  BP: 122/63  Pulse: 90  SpO2: 98%  Weight: 123 lb 4 oz (55.9 kg)   Wt Readings from Last 3 Encounters:  08/05/24 123 lb 4 oz (55.9 kg)  07/18/24 127 lb (57.6 kg)  07/15/24 132 lb (59.9 kg)   Lab Results  Component Value Date   CREATININE 2.84 (H) 07/18/2024   CREATININE 2.20 (H) 06/09/2024   CREATININE 2.43 (H) 05/31/2024     ReDs reading: 39 %, abnormal (see plan below)   ASSESSMENT & PLAN:  1. Chronic systolic heart failure  - Diagnosed 6/25. Echo EF < 25% - No cath with CKD IV - Refused cMRI - Stable/improved NYHA class II-III - Volume ok at 39% Detailed discussion about using sliding-scale diuretics - Continue carvedilol  12.5mg  BID - Continue furosemide  40mg  daily.  - Continue hydralazine  25mg  TID - Continue jardiance  10mg  daily.  - No MRA or RAASi due to CKD IV - No room to titrate GDMT - Recent labs with Nephrology - Refer CR - Not ICD canddiate  2. Hypertension: - Improved with GDMT - Do not want to push BP down much lower with CKD IV  3. CAD - Coronary calcium  noted on CT scan - No role for cath with CKD IV and no angina - Followed by General Cardiology. Continue atorvastatin  40mg  daily  4. CKD IV - Follows with  Nephrology [name])  - continue jardiance  10mg  daily  5. Tachypalpitations - place 14-day Zio XT  6. Alcohol and tobacco abuse - No alcohol since admission 06/25 - No smoking since admission 06/25. Does continue to use nicotine  patches  Precious Segall, MD  12:48 PM

## 2024-08-05 NOTE — Progress Notes (Signed)
 ReDS Vest / Clip - 08/05/24 1100       ReDS Vest / Clip   Station Marker A    Ruler Value 29    ReDS Value Range Moderate volume overload    ReDS Actual Value 39

## 2024-08-05 NOTE — Progress Notes (Signed)
 Zio patch placed onto patient.  All instructions and information reviewed with patient, they verbalize understanding with no questions.

## 2024-08-10 ENCOUNTER — Telehealth: Payer: Self-pay | Admitting: Student in an Organized Health Care Education/Training Program

## 2024-08-10 NOTE — Telephone Encounter (Signed)
 Victor Willis during called the on-call cardiology pager to discuss his fluid status. He last saw Dr. Cherrie days earlier on August 26 for heart failure. At that point in time, he was euvolemic. He states that the next day he began to notice ankle swelling. He has doubled his Lasix  dose for the last 4 days without any improvement in the swelling.  He has also noticed some slight difficulty in breathing. His Willis pressure have been in the 130s over 80s (he was 122/63 last in clinic).  He does not feel comfortable increasing his home Lasix  dose further.  We discussed him coming to the emergency room for a dose of IV Lasix  and labs to check his renal function. I will notify his primary cardiology team.  Victor Blood, MD MS  Cardiology [employer]

## 2024-08-14 ENCOUNTER — Telehealth: Payer: Self-pay

## 2024-08-14 NOTE — Progress Notes (Signed)
 Pt called and stated he recently checked his BP and his HR is 120 bpm with SOB. Pt has no other symptoms and BP is normal at 132/85. Pt has not done any physical activity. Pt has been sitting about to take a nap when noticing this.  Please advise. Pt currently wearing heart monitor.

## 2024-08-15 ENCOUNTER — Telehealth: Payer: Self-pay

## 2024-08-15 NOTE — Progress Notes (Signed)
 Pt returned call. Pt states he has been having consistent SOB, retaining fluid whenever he doesn't double up on his fluid pill. Spoke to Dr. Zenaida regarding pt sx. Per Dr. Zenaida, pt should double up on his fluid pill throughout the weekend (80 MG QD) and be re-evaluated on Monday. Notified pt. Pt agreed. Pt appt made for Monday at 11:45 AM.

## 2024-08-16 ENCOUNTER — Other Ambulatory Visit: Payer: Self-pay

## 2024-08-16 ENCOUNTER — Emergency Department

## 2024-08-16 ENCOUNTER — Inpatient Hospital Stay
Admission: EM | Admit: 2024-08-16 | Discharge: 2024-08-19 | DRG: 291 | Disposition: A | Discharge status: Home / Self Care | Attending: Internal Medicine | Admitting: Internal Medicine

## 2024-08-16 DIAGNOSIS — E872 Acidosis, unspecified: Secondary | ICD-10-CM | POA: Diagnosis present

## 2024-08-16 DIAGNOSIS — Z888 Allergy status to other drugs, medicaments and biological substances status: Secondary | ICD-10-CM

## 2024-08-16 DIAGNOSIS — I700 Atherosclerosis of aorta: Secondary | ICD-10-CM | POA: Diagnosis present

## 2024-08-16 DIAGNOSIS — R54 Age-related physical debility: Secondary | ICD-10-CM | POA: Diagnosis present

## 2024-08-16 DIAGNOSIS — I10 Essential (primary) hypertension: Secondary | ICD-10-CM | POA: Diagnosis not present

## 2024-08-16 DIAGNOSIS — Z96643 Presence of artificial hip joint, bilateral: Secondary | ICD-10-CM | POA: Diagnosis present

## 2024-08-16 DIAGNOSIS — I272 Pulmonary hypertension, unspecified: Secondary | ICD-10-CM | POA: Diagnosis present

## 2024-08-16 DIAGNOSIS — M459 Ankylosing spondylitis of unspecified sites in spine: Secondary | ICD-10-CM | POA: Diagnosis present

## 2024-08-16 DIAGNOSIS — I5022 Chronic systolic (congestive) heart failure: Secondary | ICD-10-CM | POA: Diagnosis not present

## 2024-08-16 DIAGNOSIS — Z833 Family history of diabetes mellitus: Secondary | ICD-10-CM | POA: Diagnosis not present

## 2024-08-16 DIAGNOSIS — I130 Hypertensive heart and chronic kidney disease with heart failure and stage 1 through stage 4 chronic kidney disease, or unspecified chronic kidney disease: Secondary | ICD-10-CM | POA: Diagnosis present

## 2024-08-16 DIAGNOSIS — G894 Chronic pain syndrome: Secondary | ICD-10-CM | POA: Diagnosis present

## 2024-08-16 DIAGNOSIS — I5040 Unspecified combined systolic (congestive) and diastolic (congestive) heart failure: Secondary | ICD-10-CM | POA: Insufficient documentation

## 2024-08-16 DIAGNOSIS — I471 Supraventricular tachycardia, unspecified: Secondary | ICD-10-CM | POA: Insufficient documentation

## 2024-08-16 DIAGNOSIS — J189 Pneumonia, unspecified organism: Secondary | ICD-10-CM

## 2024-08-16 DIAGNOSIS — I493 Ventricular premature depolarization: Secondary | ICD-10-CM | POA: Diagnosis not present

## 2024-08-16 DIAGNOSIS — I428 Other cardiomyopathies: Secondary | ICD-10-CM | POA: Diagnosis present

## 2024-08-16 DIAGNOSIS — Z720 Tobacco use: Secondary | ICD-10-CM | POA: Diagnosis present

## 2024-08-16 DIAGNOSIS — E861 Hypovolemia: Secondary | ICD-10-CM | POA: Diagnosis not present

## 2024-08-16 DIAGNOSIS — N184 Chronic kidney disease, stage 4 (severe): Secondary | ICD-10-CM | POA: Diagnosis present

## 2024-08-16 DIAGNOSIS — I5084 End stage heart failure: Secondary | ICD-10-CM | POA: Diagnosis present

## 2024-08-16 DIAGNOSIS — I2489 Other forms of acute ischemic heart disease: Secondary | ICD-10-CM | POA: Diagnosis present

## 2024-08-16 DIAGNOSIS — I2510 Atherosclerotic heart disease of native coronary artery without angina pectoris: Secondary | ICD-10-CM | POA: Diagnosis present

## 2024-08-16 DIAGNOSIS — I5023 Acute on chronic systolic (congestive) heart failure: Principal | ICD-10-CM | POA: Diagnosis present

## 2024-08-16 DIAGNOSIS — Z79899 Other long term (current) drug therapy: Secondary | ICD-10-CM

## 2024-08-16 DIAGNOSIS — F1010 Alcohol abuse, uncomplicated: Secondary | ICD-10-CM | POA: Diagnosis present

## 2024-08-16 DIAGNOSIS — I5043 Acute on chronic combined systolic (congestive) and diastolic (congestive) heart failure: Secondary | ICD-10-CM

## 2024-08-16 DIAGNOSIS — I509 Heart failure, unspecified: Secondary | ICD-10-CM

## 2024-08-16 DIAGNOSIS — E785 Hyperlipidemia, unspecified: Secondary | ICD-10-CM | POA: Diagnosis present

## 2024-08-16 DIAGNOSIS — E1122 Type 2 diabetes mellitus with diabetic chronic kidney disease: Secondary | ICD-10-CM | POA: Diagnosis present

## 2024-08-16 DIAGNOSIS — Z7984 Long term (current) use of oral hypoglycemic drugs: Secondary | ICD-10-CM | POA: Diagnosis not present

## 2024-08-16 DIAGNOSIS — Z885 Allergy status to narcotic agent status: Secondary | ICD-10-CM | POA: Diagnosis not present

## 2024-08-16 DIAGNOSIS — R002 Palpitations: Secondary | ICD-10-CM | POA: Diagnosis not present

## 2024-08-16 DIAGNOSIS — Z7982 Long term (current) use of aspirin: Secondary | ICD-10-CM

## 2024-08-16 DIAGNOSIS — E119 Type 2 diabetes mellitus without complications: Secondary | ICD-10-CM

## 2024-08-16 DIAGNOSIS — R0602 Shortness of breath: Secondary | ICD-10-CM | POA: Diagnosis present

## 2024-08-16 DIAGNOSIS — Z87891 Personal history of nicotine dependence: Secondary | ICD-10-CM

## 2024-08-16 DIAGNOSIS — E8770 Fluid overload, unspecified: Secondary | ICD-10-CM | POA: Diagnosis present

## 2024-08-16 LAB — CBC WITH DIFFERENTIAL/PLATELET
Abs Immature Granulocytes: 0.06 K/uL (ref 0.00–0.07)
Basophils Absolute: 0.1 K/uL (ref 0.0–0.1)
Basophils Relative: 1 %
Eosinophils Absolute: 0.3 K/uL (ref 0.0–0.5)
Eosinophils Relative: 3 %
HCT: 29.1 % — ABNORMAL LOW (ref 39.0–52.0)
Hemoglobin: 9.2 g/dL — ABNORMAL LOW (ref 13.0–17.0)
Immature Granulocytes: 1 %
Lymphocytes Relative: 8 %
Lymphs Abs: 0.7 K/uL (ref 0.7–4.0)
MCH: 30.9 pg (ref 26.0–34.0)
MCHC: 31.6 g/dL (ref 30.0–36.0)
MCV: 97.7 fL (ref 80.0–100.0)
Monocytes Absolute: 0.4 K/uL (ref 0.1–1.0)
Monocytes Relative: 5 %
Neutro Abs: 6.6 K/uL (ref 1.7–7.7)
Neutrophils Relative %: 82 %
Platelets: 298 K/uL (ref 150–400)
RBC: 2.98 MIL/uL — ABNORMAL LOW (ref 4.22–5.81)
RDW: 14.9 % (ref 11.5–15.5)
WBC: 8.1 K/uL (ref 4.0–10.5)
nRBC: 0 % (ref 0.0–0.2)

## 2024-08-16 LAB — COMPREHENSIVE METABOLIC PANEL WITH GFR
ALT: 9 U/L (ref 0–44)
AST: 22 U/L (ref 15–41)
Albumin: 2.7 g/dL — ABNORMAL LOW (ref 3.5–5.0)
Alkaline Phosphatase: 68 U/L (ref 38–126)
Anion gap: 12 (ref 5–15)
BUN: 61 mg/dL — ABNORMAL HIGH (ref 8–23)
CO2: 20 mmol/L — ABNORMAL LOW (ref 22–32)
Calcium: 8.2 mg/dL — ABNORMAL LOW (ref 8.9–10.3)
Chloride: 103 mmol/L (ref 98–111)
Creatinine, Ser: 2.81 mg/dL — ABNORMAL HIGH (ref 0.61–1.24)
GFR, Estimated: 24 mL/min — ABNORMAL LOW (ref >60)
Glucose, Bld: 206 mg/dL — ABNORMAL HIGH (ref 70–99)
Potassium: 4.2 mmol/L (ref 3.5–5.1)
Sodium: 135 mmol/L (ref 135–145)
Total Bilirubin: 0.6 mg/dL (ref 0.0–1.2)
Total Protein: 6.2 g/dL — ABNORMAL LOW (ref 6.5–8.1)

## 2024-08-16 LAB — CBG MONITORING, ED: Glucose-Capillary: 250 mg/dL — ABNORMAL HIGH (ref 70–99)

## 2024-08-16 LAB — TROPONIN I (HIGH SENSITIVITY)
Troponin I (High Sensitivity): 51 ng/L — ABNORMAL HIGH (ref <18)
Troponin I (High Sensitivity): 50 ng/L — ABNORMAL HIGH (ref <18)

## 2024-08-16 LAB — BRAIN NATRIURETIC PEPTIDE: B Natriuretic Peptide: 4199 pg/mL — ABNORMAL HIGH (ref 0.0–100.0)

## 2024-08-16 LAB — PROCALCITONIN: Procalcitonin: <0.1 ng/mL

## 2024-08-16 MED ORDER — FUROSEMIDE 10 MG/ML IJ SOLN
80.0000 mg | Freq: Once | INTRAMUSCULAR | Status: AC
  Administered 2024-08-16: 80 mg via INTRAVENOUS
  Filled 2024-08-16: qty 8

## 2024-08-16 MED ORDER — SODIUM CHLORIDE 0.9 % IV SOLN
1.0000 g | Freq: Once | INTRAVENOUS | Status: AC
  Administered 2024-08-16: 1 g via INTRAVENOUS
  Filled 2024-08-16: qty 10

## 2024-08-16 MED ORDER — FUROSEMIDE 10 MG/ML IJ SOLN
80.0000 mg | Freq: Every day | INTRAMUSCULAR | Status: AC
  Administered 2024-08-17: 80 mg via INTRAVENOUS
  Filled 2024-08-16: qty 8

## 2024-08-16 MED ORDER — INSULIN ASPART 100 UNIT/ML IJ SOLN
0.0000 [IU] | Freq: Every day | INTRAMUSCULAR | Status: DC
  Administered 2024-08-16: 2 [IU] via SUBCUTANEOUS
  Filled 2024-08-16: qty 1

## 2024-08-16 MED ORDER — ACETAMINOPHEN 325 MG PO TABS: 650.0000 mg | ORAL_TABLET | Freq: Four times a day (QID) | ORAL | Status: DC | PRN

## 2024-08-16 MED ORDER — SENNOSIDES-DOCUSATE SODIUM 8.6-50 MG PO TABS: 1.0000 | ORAL_TABLET | Freq: Every evening | ORAL | Status: DC | PRN

## 2024-08-16 MED ORDER — SODIUM CHLORIDE 0.9 % IV SOLN
500.0000 mg | Freq: Once | INTRAVENOUS | Status: AC
  Administered 2024-08-16: 500 mg via INTRAVENOUS
  Filled 2024-08-16: qty 5

## 2024-08-16 MED ORDER — HEPARIN SODIUM (PORCINE) 5000 UNIT/ML IJ SOLN
5000.0000 [IU] | Freq: Three times a day (TID) | INTRAMUSCULAR | Status: DC
  Administered 2024-08-16 – 2024-08-19 (×8): 5000 [IU] via SUBCUTANEOUS
  Filled 2024-08-16 (×9): qty 1

## 2024-08-16 MED ORDER — INSULIN ASPART 100 UNIT/ML IJ SOLN
0.0000 [IU] | Freq: Three times a day (TID) | INTRAMUSCULAR | Status: DC
  Administered 2024-08-17 (×2): 2 [IU] via SUBCUTANEOUS
  Administered 2024-08-18: 1 [IU] via SUBCUTANEOUS
  Administered 2024-08-18: 7 [IU] via SUBCUTANEOUS
  Administered 2024-08-18 – 2024-08-19 (×2): 2 [IU] via SUBCUTANEOUS
  Filled 2024-08-16 (×4): qty 1
  Filled 2024-08-16 (×2): qty 2

## 2024-08-16 MED ORDER — CARVEDILOL 12.5 MG PO TABS
12.5000 mg | ORAL_TABLET | Freq: Two times a day (BID) | ORAL | Status: DC
  Administered 2024-08-17 – 2024-08-19 (×5): 12.5 mg via ORAL
  Filled 2024-08-16: qty 2
  Filled 2024-08-16 (×3): qty 1
  Filled 2024-08-16: qty 2

## 2024-08-16 MED ORDER — HYDRALAZINE HCL 50 MG PO TABS
50.0000 mg | ORAL_TABLET | Freq: Two times a day (BID) | ORAL | Status: DC
  Administered 2024-08-17 – 2024-08-19 (×5): 50 mg via ORAL
  Filled 2024-08-16 (×5): qty 1

## 2024-08-16 MED ORDER — ONDANSETRON HCL 4 MG/2ML IJ SOLN
4.0000 mg | Freq: Four times a day (QID) | INTRAMUSCULAR | Status: DC | PRN
  Administered 2024-08-16: 4 mg via INTRAVENOUS
  Filled 2024-08-16: qty 2

## 2024-08-16 MED ORDER — HYDRALAZINE HCL 20 MG/ML IJ SOLN: 5.0000 mg | Freq: Four times a day (QID) | INTRAMUSCULAR | Status: DC | PRN

## 2024-08-16 MED ORDER — ASPIRIN 81 MG PO CHEW
81.0000 mg | CHEWABLE_TABLET | Freq: Every day | ORAL | Status: DC
  Administered 2024-08-17 – 2024-08-19 (×3): 81 mg via ORAL
  Filled 2024-08-16 (×3): qty 1

## 2024-08-16 MED ORDER — METOPROLOL TARTRATE 5 MG/5ML IV SOLN: 5.0000 mg | INTRAVENOUS | Status: DC | PRN

## 2024-08-16 MED ORDER — ACETAMINOPHEN 650 MG RE SUPP: 650.0000 mg | Freq: Four times a day (QID) | RECTAL | Status: DC | PRN

## 2024-08-16 MED ORDER — ATORVASTATIN CALCIUM 20 MG PO TABS
40.0000 mg | ORAL_TABLET | Freq: Every day | ORAL | Status: DC
  Administered 2024-08-16 – 2024-08-18 (×3): 40 mg via ORAL
  Filled 2024-08-16 (×3): qty 2

## 2024-08-16 MED ORDER — ONDANSETRON HCL 4 MG PO TABS
4.0000 mg | ORAL_TABLET | Freq: Four times a day (QID) | ORAL | Status: DC | PRN
Start: 2024-08-16 — End: 2024-08-21

## 2024-08-16 NOTE — ED Triage Notes (Signed)
 Pt brought in by EMS for c/o of increased HR & DOE x 3 days. States he noticed increased swelling in BLE so doubled up on his lasix  for the last 3 days - taking 80mg  every day instead of his normal 40mg  daily.

## 2024-08-16 NOTE — Assessment & Plan Note (Addendum)
 Home hydralazine  50 mg p.o. twice daily, Coreg  12.5 mg p.o. twice daily were resumed Hydralazine  5 mg IV every 6 hours as needed if SBP 170, 5 days ordered

## 2024-08-16 NOTE — Assessment & Plan Note (Signed)
 Home Coreg  12.5 mg p.o. twice daily resumed Home furosemide  not resumed on admission Furosemide  80 mg IV in the ED and 1 dose ordered for 08/17/2024 Strict I's and O's

## 2024-08-16 NOTE — Assessment & Plan Note (Signed)
 Strict I's and O's Complete echo on 05/28/2024 was read as estimate ejection fraction less than 20%, grade 2 diastolic dysfunction Status post furosemide  80 mg IV one-time dose per EDP Furosemide  80 mg IV one-time dose ordered for 08/17/2024

## 2024-08-16 NOTE — H&P (Addendum)
 History and Physical   [name] FMW:[id number] DOB: [birth year] DOA: 08/16/2024  PCP: Diedra Lame, MD  Patient coming from: Home via EMS  I have personally briefly reviewed patient's old medical records in [facility] EMR.  Chief Concern: Shortness of breath, swelling of the legs  HPI: Mr. Victor Willis is a 68 year old male with history of hypertension, non-insulin -dependent diabetes mellitus, hyperlipidemia, heart failure reduced ejection fraction, who presents emergency department for chief concerns of swelling of his legs, dyspnea on exertion.  Vitals in the ED showed t of 97.6, rr 18, hr 104, blood pressure 130/86, SpO2 97% on room air.  Serum sodium is 135, potassium 4.2, chloride 103, bicarb 20, BUN of 61, serum creatinine of 2.81, eGFR 24, nonfasting glucose 206, WBC 8.1, hemoglobin 9.2, platelets of 298.  BNP is 4199.  HS opponent is 51.  Calcium  level is 8.2.  ED treatment: Azithromycin  500 mg IV one-time dose, ceftriaxone  1 g IV one-time dose, furosemide  80 mg IV one-time dose. ----------------------------------------- At bedside, patient he is able to tell me his name, age, current year, current location.   He reports he has been short of breath for 3 days. He denies fever, cough, chills, known sick contacts.He denies chest pain.  He does not check is weight. He drinks 60 oz of fluid per day (juice, coffee, water, etc). He endorses low sodium diet.  Patient reports he took his Coreg  morning and evening dose prior to coming to the ED today.  Social history: He lives at home with his son and finance. He denies tobacco, etoh, recreational drug use. He is retired and previously Ecologist.  ROS: Constitutional: no weight change, no fever ENT/Mouth: no sore throat, no rhinorrhea Eyes: no eye pain, no vision changes Cardiovascular: no chest pain, + dyspnea,  + edema, no palpitations Respiratory: no cough, no sputum, no  wheezing Gastrointestinal: no nausea, no vomiting, no diarrhea, no constipation Genitourinary: no urinary incontinence, no dysuria, no hematuria Musculoskeletal: no arthralgias, no myalgias Skin: no skin lesions, no pruritus, Neuro: + weakness, no loss of consciousness, no syncope Psych: no anxiety, no depression, + decrease appetite Heme/Lymph: no bruising, no bleeding  ED Course: Discussed with EDP, patient requiring hospitalization for chief concerns of heart failure exacerbation.  Assessment/Plan  Principal Problem:   Acute exacerbation of CHF (congestive heart failure) (HCC) Active Problems:   Ankylosing spondylitis (HCC)   Essential hypertension   Tobacco abuse   Fluid overload   CKD (chronic kidney disease), stage IV (HCC)   Shortness of breath   Hyperlipidemia   Diabetes mellitus type 2, noninsulin dependent (HCC)   Heart failure with reduced ejection fraction (HFrEF, <= 40%) and combined systolic and diastolic dysfunction (HCC)   Assessment and Plan:  * Acute exacerbation of CHF (congestive heart failure) (HCC) Strict I's and O's Complete echo on 05/28/2024 was read as estimate ejection fraction less than 20%, grade 2 diastolic dysfunction Status post furosemide  80 mg IV one-time dose per EDP Furosemide  80 mg IV one-time dose ordered for 08/17/2024  Heart failure with reduced ejection fraction (HFrEF, <= 40%) and combined systolic and diastolic dysfunction (HCC) Home Coreg  12.5 mg p.o. twice daily resumed Home furosemide  not resumed on admission Furosemide  80 mg IV in the ED and 1 dose ordered for 08/17/2024 Strict I's and O's  Diabetes mellitus type 2, noninsulin dependent (HCC) Inpatient with CKD stage IV Home Jardiance  will not be resumed on admission Insulin  SSI with at bedtime coverage ordered  Hyperlipidemia Home atorvastatin  40 mg nightly resumed  CKD (chronic kidney disease), stage IV (HCC) At baseline, strict I's and O's, recheck BMP in the  a.m.  Essential hypertension Home hydralazine  50 mg p.o. twice daily, Coreg  12.5 mg p.o. twice daily were resumed Hydralazine  5 mg IV every 6 hours as needed if SBP 170, 5 days ordered  Chest x-ray read of possible pneumonia-patient has no signs or symptoms of pneumonia including cough, fever, chills, congestion There was no leukocytosis on admission Patient is status post ceftriaxone  1 g IV one-time dose and azithromycin  500 mg IV one-time dose per EDP I have ordered procalcitonin and I have not initiated antibiotic pending procalcitonin  Chart reviewed.   DVT prophylaxis: Heparin  5000 units subcutaneous every 8 hours Code Status: full code  Diet: heart healthy Family Communication: no, patient states he will update his fiance Disposition Plan: pending clinical  Consults called: not at this time Admission status: inpatient, cardiac   Past Medical History:  Diagnosis Date   Alcohol abuse    Ankylosing spondylitis (HCC)    Aortic atherosclerosis (HCC)    Chronic kidney disease (CKD), stage IV (severe) (HCC)    Coronary artery calcification    Hypertension    Macrocytic anemia    Past Surgical History:  Procedure Laterality Date   BACK SURGERY     RIGHT HEART CATH N/A 05/30/2024   Procedure: RIGHT HEART CATH;  Surgeon: Gardenia Led, DO;  Location: [facility] INVASIVE CV LAB;  Service: Cardiovascular;  Laterality: N/A;   TOTAL HIP ARTHROPLASTY Bilateral    Social History:  reports that he has quit smoking. His smoking use included cigarettes. He has never used smokeless tobacco. He reports current alcohol use. No history on file for drug use.  Allergies  Allergen Reactions   Celecoxib Rash, Anaphylaxis and Dermatitis   Atorvastatin  Other (See Comments)    fatigue   Morphine  Other (See Comments)    PT STATE IT MAKE HIM FEEL WAY TOO LOOPY, CAN TOLERATE DILAUDID  VERY WELL     PT CAN ALSO TOLERATE HYDROCODONE VERY WELL   Prednisone Other (See Comments)    Throat swelling per  patient   Family History  Problem Relation Age of Onset   Diabetes Mellitus II Mother    Diabetes Mellitus II Father    Ankylosing spondylitis Daughter    Family history: Family history reviewed and not pertinent.  Prior to Admission medications   Medication Sig Start Date End Date Taking? Authorizing Provider  aspirin  81 MG chewable tablet Chew 81 mg by mouth daily.    [provider]  atorvastatin  (LIPITOR) 40 MG tablet Take 1 tablet (40 mg total) by mouth daily. 06/04/24   Zenaida Morene PARAS, MD  carvedilol  (COREG ) 12.5 MG tablet Take 12.5 mg by mouth 2 (two) times daily with a meal.    [provider]  cyclobenzaprine (FLEXERIL) 10 MG tablet Take 10 mg by mouth 2 (two) times daily. 09/19/19   [provider]  furosemide  (LASIX ) 40 MG tablet Take 1 tablet (40 mg total) by mouth daily. 07/15/24   Donette Ellouise LABOR, FNP  hydrALAZINE  (APRESOLINE ) 25 MG tablet Take 1 tablet (25 mg total) by mouth every 8 (eight) hours. Patient taking differently: Take 50 mg by mouth 2 (two) times daily. 05/31/24   Maree, Pratik D, DO  JARDIANCE  10 MG TABS tablet Take 1 tablet (10 mg total) by mouth daily before breakfast. 07/15/24   Donette Ellouise LABOR, FNP  Multiple Vitamin (MULTI-VITAMIN) tablet Take  1 tablet by mouth daily.    [provider]  Multiple Vitamins-Minerals (MULTIVITAMIN GUMMIES ADULT PO) Take 2 each by mouth daily.    [provider]  oxycodone  (ROXICODONE ) 30 MG immediate release tablet Take 30 mg by mouth 5 (five) times daily. 10/23/19   [provider]  sulfaSALAzine  (AZULFIDINE ) 500 MG tablet Take 500 mg by mouth 2 (two) times daily. 09/19/19   [provider]   Physical Exam: Vitals:   08/16/24 1728 08/16/24 1729 08/16/24 1800  BP: 130/86  125/84  Pulse: (!) 104  (!) 101  Resp: 18  18  Temp: 97.6 F (36.4 C)    TempSrc: Oral    SpO2: 97%  99%  Weight:  54.4 kg   Height:  5' (1.524 m)    Constitutional: appears age appropriate,  frail, mildly short of breath Eyes: PERRL, lids and conjunctivae normal ENMT: Mucous membranes are moist. Posterior pharynx clear of any exudate or lesions. Age-appropriate dentition. Hearing appropriate Neck: normal, supple, no masses, no thyromegaly Respiratory: Decreased lung sounds at the left lower lobe, no wheezing, no crackles. Normal respiratory effort. No accessory muscle use.  Cardiovascular: Regular rate and rhythm, no murmurs / rubs / gallops. 2 + bilateral pitting extremity edema. 2+ pedal pulses. No carotid bruits.  Abdomen: no tenderness, no masses palpated, no hepatosplenomegaly. Bowel sounds positive.  Musculoskeletal: no clubbing / cyanosis. No joint deformity upper and lower extremities. Good ROM, no contractures, no atrophy. Normal muscle tone.  Skin: no rashes, lesions, ulcers. No induration Neurologic: Sensation intact. Strength 5/5 in all 4.  Psychiatric: Normal judgment and insight. Alert and oriented x 3. Normal mood.   EKG: independently reviewed, showing sinus tachycardia with rate of 102, QTc 467  Chest x-ray on Admission: I personally reviewed and I agree with radiologist reading as below.  DG Chest 2 View Result Date: 08/16/2024 CLINICAL DATA:  Shortness of breath EXAM: CHEST - 2 VIEW COMPARISON:  06/09/2024 FINDINGS: Stable heart and mediastinal contours. Right lung clear. Patchy opacity left lung base concerning for pneumonia. Small left pleural effusion. No pneumothorax or acute bony abnormality. IMPRESSION: Left basilar opacity concerning for pneumonia.  Small left effusion. Electronically Signed   By: Franky Crease M.D.   On: 08/16/2024 18:12   Labs on Admission: I have personally reviewed following labs  CBC: Recent Labs  Lab 08/16/24 1732  WBC 8.1  NEUTROABS 6.6  HGB 9.2*  HCT 29.1*  MCV 97.7  PLT 298   Basic Metabolic Panel: Recent Labs  Lab 08/16/24 1732  NA 135  K 4.2  CL 103  CO2 20*  GLUCOSE 206*  BUN 61*  CREATININE 2.81*  CALCIUM   8.2*   GFR: Estimated Creatinine Clearance: 18 mL/min (A) (by C-G formula based on SCr of 2.81 mg/dL (H)).  Liver Function Tests: Recent Labs  Lab 08/16/24 1732  AST 22  ALT 9  ALKPHOS 68  BILITOT 0.6  PROT 6.2*  ALBUMIN 2.7*   Urine analysis:    Component Value Date/Time   COLORURINE YELLOW (A) 10/28/2019 2317   APPEARANCEUR CLEAR (A) 10/28/2019 2317   LABSPEC 1.023 10/28/2019 2317   PHURINE 6.0 10/28/2019 2317   GLUCOSEU 50 (A) 10/28/2019 2317   HGBUR NEGATIVE 10/28/2019 2317   BILIRUBINUR NEGATIVE 10/28/2019 2317   KETONESUR 5 (A) 10/28/2019 2317   PROTEINUR >=300 (A) 10/28/2019 2317   NITRITE NEGATIVE 10/28/2019 2317   LEUKOCYTESUR NEGATIVE 10/28/2019 2317   This document was prepared using Dragon Voice Recognition software  and may include unintentional dictation errors.  Dr. Sherre [facility]  If 7PM-7AM, please contact overnight-coverage provider If 7AM-7PM, please contact day attending provider [email or web address]  08/16/2024, 7:06 PM

## 2024-08-16 NOTE — Assessment & Plan Note (Signed)
 Inpatient with CKD stage IV Home Jardiance  will not be resumed on admission Insulin  SSI with at bedtime coverage ordered

## 2024-08-16 NOTE — Assessment & Plan Note (Addendum)
 At baseline, strict I's and O's, recheck BMP in the a.m.

## 2024-08-16 NOTE — ED Provider Notes (Signed)
 [facility] Provider Note    Event Date/Time   First MD Initiated Contact with Patient 08/16/24 1725     (approximate)   History   Chief Complaint Shortness of Breath   HPI  Victor Willis is a 68 y.o. male with a past medical history of hypertension, CHF (EF less than 20%), CKD, ankylosing spondylitis, and alcohol abuse who presents to the ED complaining of shortness of breath.  Patient reports that over the past week he has had increasing swelling in his legs as well as some difficulty breathing.  He has increased his Lasix  dose at home from 40 mg daily to 80 mg daily, but continues to have leg swelling and difficulty breathing.  He states that his urine output is normal but he feels like his heart has been racing at times and describes some generalized weakness.  He denies any fevers, cough, nausea, vomiting, diarrhea, or dysuria.     Physical Exam   Triage Vital Signs: ED Triage Vitals  Encounter Vitals Group     BP 08/16/24 1728 130/86     Girls Systolic BP Percentile --      Girls Diastolic BP Percentile --      Boys Systolic BP Percentile --      Boys Diastolic BP Percentile --      Pulse Rate 08/16/24 1728 (!) 104     Resp 08/16/24 1728 18     Temp 08/16/24 1728 97.6 F (36.4 C)     Temp Source 08/16/24 1728 Oral     SpO2 08/16/24 1728 97 %     Weight 08/16/24 1729 120 lb (54.4 kg)     Height 08/16/24 1729 5' (1.524 m)     Head Circumference --      Peak Flow --      Pain Score 08/16/24 1729 0     Pain Loc --      Pain Education --      Exclude from Growth Chart --     Most recent vital signs: Vitals:   08/16/24 1728  BP: 130/86  Pulse: (!) 104  Resp: 18  Temp: 97.6 F (36.4 C)  SpO2: 97%    Constitutional: Alert and oriented. Eyes: Conjunctivae are normal. Head: Atraumatic. Nose: No congestion/rhinnorhea. Mouth/Throat: Mucous membranes are moist.  Cardiovascular: Normal rate, regular rhythm. Grossly normal heart  sounds.  2+ radial pulses bilaterally. Respiratory: Normal respiratory effort.  No retractions. Lungs CTAB. Gastrointestinal: Soft and nontender. No distention. Musculoskeletal: No lower extremity tenderness, 1+ pitting edema to knees bilaterally. Neurologic:  Normal speech and language. No gross focal neurologic deficits are appreciated.    ED Results / Procedures / Treatments   Labs (all labs ordered are listed, but only abnormal results are displayed) Labs Reviewed  CBC WITH DIFFERENTIAL/PLATELET - Abnormal; Notable for the following components:      Result Value   RBC 2.98 (*)    Hemoglobin 9.2 (*)    HCT 29.1 (*)    All other components within normal limits  COMPREHENSIVE METABOLIC PANEL WITH GFR - Abnormal; Notable for the following components:   CO2 20 (*)    Glucose, Bld 206 (*)    BUN 61 (*)    Creatinine, Ser 2.81 (*)    Calcium  8.2 (*)    Total Protein 6.2 (*)    Albumin 2.7 (*)    GFR, Estimated 24 (*)    All other components within normal limits  BRAIN NATRIURETIC PEPTIDE -  Abnormal; Notable for the following components:   B Natriuretic Peptide 4,199.0 (*)    All other components within normal limits  TROPONIN I (HIGH SENSITIVITY) - Abnormal; Notable for the following components:   Troponin I (High Sensitivity) 51 (*)    All other components within normal limits     EKG  ED ECG REPORT I, Carlin Palin, the attending physician, personally viewed and interpreted this ECG.   Date: 08/16/2024  EKG Time: 17:35  Rate: 102  Rhythm: sinus tachycardia  Axis: Normal  Intervals:nonspecific intraventricular conduction delay  ST&T Change: LVH, diffuse T wave inversions  RADIOLOGY Chest x-ray reviewed and interpreted by me with pulmonary edema and possible left lower lobe infiltrate.  PROCEDURES:  Critical Care performed: No  Procedures   MEDICATIONS ORDERED IN ED: Medications  cefTRIAXone  (ROCEPHIN ) 1 g in sodium chloride  0.9 % 100 mL IVPB (1 g  Intravenous New Bag/Given 08/16/24 1837)  azithromycin  (ZITHROMAX ) 500 mg in sodium chloride  0.9 % 250 mL IVPB (has no administration in time range)  furosemide  (LASIX ) injection 80 mg (80 mg Intravenous Given 08/16/24 1834)     IMPRESSION / MDM / ASSESSMENT AND PLAN / ED COURSE  I reviewed the triage vital signs and the nursing notes.                              68 y.o. male with past medical history of hypertension, CHF (EF less than 20%), CKD, ankylosing spondylitis, and alcohol abuse who presents to the ED complaining of increasing difficulty breathing, leg swelling, palpitations, and malaise over the past week.  Patient's presentation is most consistent with acute presentation with potential threat to life or bodily function.  Differential diagnosis includes, but is not limited to, arrhythmia, ACS, CHF exacerbation, renal failure, anemia, electrolyte abnormality, AKI.  Patient chronically ill-appearing but in no acute distress, vital signs remarkable for mild tachycardia but otherwise reassuring.  He is not in any respiratory distress and maintaining oxygen saturations at 97% on room air.  EKG shows LVH and T wave inversions similar to previous, labs and chest x-ray are pending at this time.  Chest x-ray concerning for left lower lobe pneumonia and I am also concerned for pulmonary edema, we will treat with IV Lasix  as well as IV Rocephin  and azithromycin .  Labs without significant leukocytosis, anemia stable compared to previous.  Renal function also stable compared to previous without acute electrolyte abnormality, troponin at prior baseline, BNP elevated consistent with CHF.  Case discussed with hospitalist for admission.      FINAL CLINICAL IMPRESSION(S) / ED DIAGNOSES   Final diagnoses:  Acute on chronic systolic congestive heart failure (HCC)  Community acquired pneumonia of left lower lobe of lung     Rx / DC Orders   ED Discharge Orders     None        Note:  This  document was prepared using Dragon voice recognition software and may include unintentional dictation errors.   Palin Carlin, MD 08/16/24 RONOLD

## 2024-08-16 NOTE — ED Notes (Signed)
Report given to Kara RN.

## 2024-08-16 NOTE — Hospital Course (Addendum)
 Mr. Victor Willis is a 68 year old male with history of hypertension, non-insulin -dependent diabetes mellitus, hyperlipidemia, heart failure reduced ejection fraction, who presents emergency department for chief concerns of swelling of his legs, dyspnea on exertion. Lab results showed creatinine of 2.81, BNP 4199. X-ray showed a small pleural effusion, left lower lobe opacity concerning for pneumonia.  Procalcitonin level less than 0.1.  Patient was diagnosed with exacerbation congestive heart failure, started on IV Lasix . Followed by cardiology, condition has improved.  Volume status improved.  Medically stable for discharge today.

## 2024-08-16 NOTE — Assessment & Plan Note (Signed)
Home atorvastatin 40 mg nightly resumed

## 2024-08-17 DIAGNOSIS — I5023 Acute on chronic systolic (congestive) heart failure: Secondary | ICD-10-CM

## 2024-08-17 DIAGNOSIS — N184 Chronic kidney disease, stage 4 (severe): Secondary | ICD-10-CM | POA: Diagnosis not present

## 2024-08-17 DIAGNOSIS — I10 Essential (primary) hypertension: Secondary | ICD-10-CM | POA: Diagnosis not present

## 2024-08-17 DIAGNOSIS — I272 Pulmonary hypertension, unspecified: Secondary | ICD-10-CM | POA: Insufficient documentation

## 2024-08-17 LAB — BASIC METABOLIC PANEL WITH GFR
Anion gap: 13 (ref 5–15)
BUN: 65 mg/dL — ABNORMAL HIGH (ref 8–23)
CO2: 19 mmol/L — ABNORMAL LOW (ref 22–32)
Calcium: 8.1 mg/dL — ABNORMAL LOW (ref 8.9–10.3)
Chloride: 104 mmol/L (ref 98–111)
Creatinine, Ser: 2.87 mg/dL — ABNORMAL HIGH (ref 0.61–1.24)
GFR, Estimated: 23 mL/min — ABNORMAL LOW (ref >60)
Glucose, Bld: 169 mg/dL — ABNORMAL HIGH (ref 70–99)
Potassium: 4.3 mmol/L (ref 3.5–5.1)
Sodium: 136 mmol/L (ref 135–145)

## 2024-08-17 LAB — CBC
HCT: 26.6 % — ABNORMAL LOW (ref 39.0–52.0)
Hemoglobin: 8.3 g/dL — ABNORMAL LOW (ref 13.0–17.0)
MCH: 30.1 pg (ref 26.0–34.0)
MCHC: 31.2 g/dL (ref 30.0–36.0)
MCV: 96.4 fL (ref 80.0–100.0)
Platelets: 270 K/uL (ref 150–400)
RBC: 2.76 MIL/uL — ABNORMAL LOW (ref 4.22–5.81)
RDW: 14.8 % (ref 11.5–15.5)
WBC: 8.1 K/uL (ref 4.0–10.5)
nRBC: 0 % (ref 0.0–0.2)

## 2024-08-17 LAB — CBG MONITORING, ED
Glucose-Capillary: 178 mg/dL — ABNORMAL HIGH (ref 70–99)
Glucose-Capillary: 179 mg/dL — ABNORMAL HIGH (ref 70–99)

## 2024-08-17 LAB — HEMOGLOBIN A1C
Hgb A1c MFr Bld: 6 % — ABNORMAL HIGH (ref 4.8–5.6)
Mean Plasma Glucose: 125.5 mg/dL

## 2024-08-17 LAB — GLUCOSE, CAPILLARY
Glucose-Capillary: 95 mg/dL (ref 70–99)
Glucose-Capillary: 160 mg/dL — ABNORMAL HIGH (ref 70–99)

## 2024-08-17 LAB — MAGNESIUM: Magnesium: 2.4 mg/dL (ref 1.7–2.4)

## 2024-08-17 MED ORDER — FUROSEMIDE 40 MG PO TABS
40.0000 mg | ORAL_TABLET | Freq: Two times a day (BID) | ORAL | Status: DC
  Administered 2024-08-17: 40 mg via ORAL
  Filled 2024-08-17: qty 1

## 2024-08-17 MED ORDER — OXYCODONE HCL 5 MG PO TABS
30.0000 mg | ORAL_TABLET | Freq: Four times a day (QID) | ORAL | Status: DC
  Administered 2024-08-17: 30 mg via ORAL
  Filled 2024-08-17: qty 6

## 2024-08-17 MED ORDER — SODIUM BICARBONATE 650 MG PO TABS
650.0000 mg | ORAL_TABLET | Freq: Two times a day (BID) | ORAL | Status: DC
  Administered 2024-08-17 – 2024-08-18 (×3): 650 mg via ORAL
  Filled 2024-08-17 (×3): qty 1

## 2024-08-17 MED ORDER — EMPAGLIFLOZIN 10 MG PO TABS
10.0000 mg | ORAL_TABLET | Freq: Every day | ORAL | Status: DC
  Administered 2024-08-17 – 2024-08-19 (×3): 10 mg via ORAL
  Filled 2024-08-17 (×4): qty 1

## 2024-08-17 MED ORDER — OXYCODONE HCL ER 10 MG PO T12A: 30.0000 mg | EXTENDED_RELEASE_TABLET | Freq: Four times a day (QID) | ORAL | Status: DC

## 2024-08-17 MED ORDER — OXYCODONE HCL 5 MG PO TABS
30.0000 mg | ORAL_TABLET | Freq: Four times a day (QID) | ORAL | Status: DC
  Administered 2024-08-17 – 2024-08-18 (×6): 30 mg via ORAL
  Administered 2024-08-19: 20 mg via ORAL
  Filled 2024-08-17 (×7): qty 6

## 2024-08-17 NOTE — TOC CM/SW Note (Signed)
..  Transition of Care [facility]) - Inpatient Brief Assessment   Patient Details  Name: Victor Willis MRN: [id number] Date of Birth: 1955/12/26  Transition of [facility]) CM/SW Contact:    Edsel DELENA Fischer, LCSW Phone Number: 08/17/2024, 10:01 AM   Clinical Narrative:  TOC to handoff to Heart failure team    Transition of Care Asessment:

## 2024-08-17 NOTE — Plan of Care (Signed)
  Problem: Coping: Goal: Ability to adjust to condition or change in health will improve Outcome: Progressing   Problem: Fluid Volume: Goal: Ability to maintain a balanced intake and output will improve Outcome: Progressing   Problem: Nutritional: Goal: Maintenance of adequate nutrition will improve Outcome: Progressing

## 2024-08-17 NOTE — Progress Notes (Signed)
 The son of the patient came out to the desk to tell me to come to the room now. They want to know why the patient is not on IV Lasix . He is on PO Lasix .  I told him that I would be in as soon as I got his medication. I stopped what I was doing, went to the pixis and pulled the patients medications for tonight. The family member wife, son and daughter on the phone are aggressive and intolerable at my entrance to the room with the patients medication  Entering the room the family asked me why the patient was not on lasix  IV. They want him on lasix  IV now.  He is on Lasix  pill.  I told them that I do not know why he is not on lasix  IV.  I do not have lasix  to give him tonight. I will contact the night doctor on call to speak to them.  The wife, daughter and son continued to tell me that they want answers now.  I looked on his note record to see if he had seen a cardiologist. He has not at this time.  When I told the family that they all started yelling at me at once about what kind of hospital this is. If this is a cardiac floor he should automatically be seeing a Cardiologist. Why he hasn't seen one now. I need to get a cardiologist now to see him.The daughter is in [place]  and the hospitals there are different.  I then told them again that I can get the night doctor here at the hospital now to see him. I answered every question that I could about the attending Doctor Laurita. Then the wife said that Dr Laurita told them that the patient would be on IV Lasix .  I  told them all that I could read in the notes while they were yelling and being very belligerent to me.  The patient was not sayiing anything.  I asked them to allow me to give the patient his medication then I would go out to call the doctor on call. The wife started cursing at me. The son said that he wanted to see the charge nurse. I medicated the patient while the wife cursed at me. The husband asked her to stop repeatedly.   I have asked the Charge  Nurse to remove me from this patients care. During report from the day nurse there was no mention of any issues pertaining to the patients medications. SABRA

## 2024-08-17 NOTE — ED Notes (Addendum)
 This tech rounded on pt. Pt is resting, call light within reach.

## 2024-08-17 NOTE — ED Notes (Signed)
 This RN messaged Victor Mana, MD regarding patient's request for pain medicine. Patient requesting oxycodone  for chronic back pain. Per pt, he takes 30mg  four times daily at home.

## 2024-08-17 NOTE — ED Notes (Signed)
 Advised nurse that patient has ready bed

## 2024-08-17 NOTE — Progress Notes (Addendum)
 Progress Note   Patient: Victor Willis Sarvis FMW:[id number] DOB: 23-Sep-1956 DOA: 08/16/2024     1 DOS: the patient was seen and examined on 08/17/2024   Brief hospital course: Mr. Victor Willis is a 68 year old male with history of hypertension, non-insulin -dependent diabetes mellitus, hyperlipidemia, heart failure reduced ejection fraction, who presents emergency department for chief concerns of swelling of his legs, dyspnea on exertion. Lab results showed creatinine of 2.81, BNP 4199. X-ray showed a small pleural effusion, left lower lobe opacity concerning for pneumonia.  Procalcitonin level less than 0.1.  Patient was diagnosed with exacerbation congestive heart failure, started on IV Lasix .    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  Patient does not have pneumonia. Condition consistent with acute exacerbation of congestive heart failure. Patient also has a profound elevation BNP, but it is not a good indicator for severity of volume overload due to advanced renal disease. Patient does have significant short of breath and paroxysmal acute dyspnea before admission, he also has significant edema. He has received IV Lasix ,  short of breath already improving.  Will continue for a day and close monitoring renal function. Due to advanced renal disease, GDMT options are limited.   CKD (chronic kidney disease), stage IV (HCC) Metabolic acidosis. Renal function is still stable after Lasix .  Reduced dose of Lasix  to 40 mg twice a day.  Monitor renal function closely.  Added sodium bicarb for metabolic acidosis.   Diabetes mellitus type 2, noninsulin dependent (HCC) Continue sliding scale insulin   Hyperlipidemia Home atorvastatin  40 mg nightly resumed  Essential hypertension Hydralazine  and Coreg .  Chronic pain syndrome Confirmed with daughter that the patient has chronic pain, was taking high-dose hydrocodone.  Restarted.     Subjective: Patient feels better with breathing today, no cough.  No chest pain.  Physical Exam: Vitals:   08/17/24 0740 08/17/24 0802 08/17/24 0830 08/17/24 1118  BP: 116/72 125/75 118/74 92/63  Pulse: 66 (!) 116 (!) 113 85  Resp: 16 (!) 22 13 18   Temp: 98.2 F (36.8 C) (!) 97.4 F (36.3 C)  (!) 97.5 F (36.4 C)  TempSrc: Oral Oral  Oral  SpO2: 95% 93% 95% 99%  Weight:      Height:       General exam: Appears calm and comfortable  Respiratory system: Clear to auscultation. Respiratory effort normal. Cardiovascular system: Regular and tachycardic. No JVD, murmurs, rubs, gallops or clicks. No pedal edema. Gastrointestinal system: Abdomen is nondistended, soft and nontender. No organomegaly or masses felt. Normal bowel sounds heard. Central nervous system: Alert and oriented. No focal neurological deficits. Extremities: Symmetric 5 x 5 power. Skin: No rashes, lesions or ulcers Psychiatry: Judgement and insight appear normal. Mood & affect appropriate.    Data Reviewed:  Reviewed the chest x-ray,  echocardiogram results and lab results.  Family Communication: daughter updated over the phone  Disposition: Status is: Inpatient Remains inpatient appropriate  because:  Disease, IV treatment     Time spent: 55 minutes  Author: Murvin Mana, MD 08/17/2024 11:22 AM  For on call review [email or web address].

## 2024-08-18 ENCOUNTER — Encounter: Admitting: Cardiology

## 2024-08-18 DIAGNOSIS — I5023 Acute on chronic systolic (congestive) heart failure: Secondary | ICD-10-CM | POA: Diagnosis not present

## 2024-08-18 DIAGNOSIS — I5022 Chronic systolic (congestive) heart failure: Secondary | ICD-10-CM

## 2024-08-18 DIAGNOSIS — I272 Pulmonary hypertension, unspecified: Secondary | ICD-10-CM

## 2024-08-18 DIAGNOSIS — N184 Chronic kidney disease, stage 4 (severe): Secondary | ICD-10-CM | POA: Diagnosis not present

## 2024-08-18 DIAGNOSIS — I10 Essential (primary) hypertension: Secondary | ICD-10-CM | POA: Diagnosis not present

## 2024-08-18 LAB — BASIC METABOLIC PANEL WITH GFR
Anion gap: 11 (ref 5–15)
BUN: 67 mg/dL — ABNORMAL HIGH (ref 8–23)
CO2: 19 mmol/L — ABNORMAL LOW (ref 22–32)
Calcium: 8.1 mg/dL — ABNORMAL LOW (ref 8.9–10.3)
Chloride: 106 mmol/L (ref 98–111)
Creatinine, Ser: 2.98 mg/dL — ABNORMAL HIGH (ref 0.61–1.24)
GFR, Estimated: 22 mL/min — ABNORMAL LOW (ref >60)
Glucose, Bld: 132 mg/dL — ABNORMAL HIGH (ref 70–99)
Potassium: 4.5 mmol/L (ref 3.5–5.1)
Sodium: 136 mmol/L (ref 135–145)

## 2024-08-18 LAB — GLUCOSE, CAPILLARY
Glucose-Capillary: 124 mg/dL — ABNORMAL HIGH (ref 70–99)
Glucose-Capillary: 327 mg/dL — ABNORMAL HIGH (ref 70–99)
Glucose-Capillary: 154 mg/dL — ABNORMAL HIGH (ref 70–99)
Glucose-Capillary: 128 mg/dL — ABNORMAL HIGH (ref 70–99)

## 2024-08-18 LAB — MAGNESIUM: Magnesium: 2.4 mg/dL (ref 1.7–2.4)

## 2024-08-18 MED ORDER — FUROSEMIDE 10 MG/ML IJ SOLN
40.0000 mg | Freq: Two times a day (BID) | INTRAMUSCULAR | Status: DC
  Administered 2024-08-18: 40 mg via INTRAVENOUS
  Filled 2024-08-18: qty 4

## 2024-08-18 MED ORDER — FUROSEMIDE 40 MG PO TABS
60.0000 mg | ORAL_TABLET | Freq: Every day | ORAL | Status: DC
  Administered 2024-08-19: 60 mg via ORAL
  Filled 2024-08-18: qty 1

## 2024-08-18 NOTE — Care Management Important Message (Signed)
 Important Message  Patient Details  Name: Victor Willis MRN: [id number] Date of Birth: Feb 23, 1956   Important Message Given:   yes     [name] 08/18/2024, 4:15 PM

## 2024-08-18 NOTE — Progress Notes (Signed)
 Heart Failure Navigator Progress Note  Assessed for Heart & Vascular TOC clinic readiness.  Patient does not meet criteria due to current Advanced Heart Failure Team patient of Jules Oar, MD.   Navigator will sign off at this time.  Celedonio Coil, RN, BSN [facility] Heart Failure Navigator Secure Chat Only

## 2024-08-18 NOTE — Care Management Important Message (Signed)
 Important Message  Patient Details  Name: Victor Willis MRN: [id number] Date of Birth: 1956/05/12   Important Message Given:  Yes - Medicare IM     Victor Willis 08/18/2024, 4:16 PM

## 2024-08-18 NOTE — Consult Note (Signed)
 Advanced Heart Failure Team History and Physical Note   PCP:  Diedra Lame, MD  PCP-Cardiology: Lonni Hanson, MD     Reason for Admission: Tachycardia   HPI:   Victor Willis is a 68 year old male with CKD 4, ankylosing spondylitis, hypertension and recently diagnosed systolic heart failure that presented to the [facility] emergency department with complaints of tachycardia lasting over 12 hours.  He recently presented to [facility] in June 2025 with shortness of breath and lower extremity edema.  Echocardiogram at that time with EF of 20% with dilated LV.  He had a right heart catheterization demonstrating cardiac index of 2.5 L/min/m. Seen in [facility] 06/25 where carvedilol  3.125mg  BID & atorvastatin  40mg  were started. ASA stopped.  Was in the ED 06/09/24 due to tachycardia and elevated blood pressure. Was concerned that recent med changes caused this. Symptoms resolved by the time EDP saw patient. Hemoglobin slightly lower, renal function slightly better. Troponins stable. CXR and BNP improved.   In the [facility] emergency department lab work was mostly unremarkable.  Serum creatinine 2.87 from 2.81. Hgb stable at 8.3 from 9.2.  EKG with sinus tachycardia and chest x-ray with mild pulmonary edema.  He was diuresed with IV Lasix  and today reports that he feels close to his baseline.  He is currently wearing a 14-day ZIO due to his frequent palpitations.  Reports today is the last day of monitoring.   Home Medications Prior to Admission medications   Medication Sig Start Date End Date Taking? Authorizing Provider  aspirin  81 MG chewable tablet Chew 81 mg by mouth daily.   Yes [provider]  atorvastatin  (LIPITOR) 40 MG tablet Take 1 tablet (40 mg total) by mouth daily. 06/04/24  Yes Zenaida Morene PARAS, MD  carvedilol  (COREG ) 12.5 MG tablet Take 12.5 mg by mouth 2 (two) times daily with a meal.   Yes [provider]  Cholecalciferol 125 MCG (5000 UT) TABS Take 5,000 Units by mouth  daily.   Yes [provider]  cyclobenzaprine (FLEXERIL) 10 MG tablet Take 10 mg by mouth 2 (two) times daily. 09/19/19  Yes [provider]  furosemide  (LASIX ) 40 MG tablet Take 1 tablet (40 mg total) by mouth daily. 07/15/24  Yes Hackney, Ellouise A, FNP  hydrALAZINE  (APRESOLINE ) 25 MG tablet Take 1 tablet (25 mg total) by mouth every 8 (eight) hours. Patient taking differently: Take 50 mg by mouth 2 (two) times daily. 05/31/24  Yes Maree, Pratik D, DO  JARDIANCE  10 MG TABS tablet Take 1 tablet (10 mg total) by mouth daily before breakfast. 07/15/24  Yes Donette Ellouise A, FNP  Multiple Vitamin (MULTI-VITAMIN) tablet Take 1 tablet by mouth daily.   Yes [provider]  oxycodone  (ROXICODONE ) 30 MG immediate release tablet Take 30 mg by mouth 4 (four) times daily. 10/23/19  Yes [provider]  sulfaSALAzine  (AZULFIDINE ) 500 MG tablet Take 500 mg by mouth 2 (two) times daily. 09/19/19  Yes [provider]    Past Medical History: Past Medical History:  Diagnosis Date   Alcohol abuse    Ankylosing spondylitis (HCC)    Aortic atherosclerosis (HCC)    Chronic kidney disease (CKD), stage IV (severe) (HCC)    Coronary artery calcification    Hypertension    Macrocytic anemia     Past Surgical History: Past Surgical History:  Procedure Laterality Date   BACK SURGERY     RIGHT HEART CATH N/A 05/30/2024   Procedure: RIGHT HEART CATH;  Surgeon: Gardenia Led,  DO;  Location: [facility] INVASIVE CV LAB;  Service: Cardiovascular;  Laterality: N/A;   TOTAL HIP ARTHROPLASTY Bilateral     Family History:  Family History  Problem Relation Age of Onset   Diabetes Mellitus II Mother    Diabetes Mellitus II Father    Ankylosing spondylitis Daughter     Social History: Social History   Socioeconomic History   Marital status: Single    Spouse name: Not on file   Number of children: Not on file   Years of education: Not on file   Highest education level: Not on  file  Occupational History   Not on file  Tobacco Use   Smoking status: Former    Types: Cigarettes   Smokeless tobacco: Never  Substance and Sexual Activity   Alcohol use: Yes    Comment: daily, heavy   Drug use: Not on file   Sexual activity: Not on file  Other Topics Concern   Not on file  Social History Narrative   Not on file   Social Drivers of Health   Financial Resource Strain: Patient Declined (04/09/2023)   Received from [facility]   Overall Financial Resource Strain (CARDIA)    Difficulty of Paying Living Expenses: Patient declined  Food Insecurity: No Food Insecurity (08/17/2024)   Hunger Vital Sign    Worried About Running Out of Food in the Last Year: Never true    Ran Out of Food in the Last Year: Never true  Transportation Needs: No Transportation Needs (08/17/2024)   PRAPARE - Administrator, Civil Service (Medical): No    Lack of Transportation (Non-Medical): No  Physical Activity: Not on file  Stress: Not on file  Social Connections: Moderately Isolated (08/17/2024)   Social Connection and Isolation Panel    Frequency of Communication with Friends and Family: Once a week    Frequency of Social Gatherings with Friends and Family: Once a week    Attends Religious Services: 1 to 4 times per year    Active Member of [employer] or Organizations: Patient declined    Attends Banker Meetings: Never    Marital Status: Living with partner    Allergies:  Allergies  Allergen Reactions   Celecoxib Rash, Anaphylaxis and Dermatitis   Morphine  Other (See Comments)    PT STATE IT MAKE HIM FEEL WAY TOO LOOPY, CAN TOLERATE DILAUDID  VERY WELL     PT CAN ALSO TOLERATE HYDROCODONE VERY WELL   Prednisone Other (See Comments)    Throat swelling per patient    Objective:    Vital Signs:   Temp:  [97.6 F (36.4 C)-97.7 F (36.5 C)] 97.7 F (36.5 C) (09/08 0740) Pulse Rate:  [81-114] 95 (09/08 0740) Resp:  [16-20] 18 (09/08  0740) BP: (120-137)/(81-93) 133/84 (09/08 0740) SpO2:  [95 %-100 %] 99 % (09/08 0740) Weight:  [55.9 kg] 55.9 kg (09/08 0500) Last BM Date : 08/16/24 Filed Weights   08/16/24 1729 08/18/24 0500  Weight: 54.4 kg 55.9 kg     Physical Exam     General:  Well appearing. No respiratory difficulty HEENT: Normal Neck: Supple. no JVD. Carotids 2+ bilat; no bruits. No lymphadenopathy or thyromegaly appreciated. Cor: PMI nondisplaced. Regular rate & rhythm. No rubs, gallops or murmurs. Lungs: Clear Abdomen: Soft, nontender, nondistended. No hepatosplenomegaly. No bruits or masses. Good bowel sounds. Extremities: No cyanosis, clubbing, rash, edema Neuro: Alert & oriented x 3, cranial nerves grossly intact. moves all 4 extremities w/o  difficulty. Affect pleasant.   Telemetry   Normal sinus rhythm  EKG   Sinus tachycardia  Labs     Basic Metabolic Panel: Recent Labs  Lab 08/16/24 1732 08/17/24 0740 08/18/24 0440  NA 135 136 136  K 4.2 4.3 4.5  CL 103 104 106  CO2 20* 19* 19*  GLUCOSE 206* 169* 132*  BUN 61* 65* 67*  CREATININE 2.81* 2.87* 2.98*  CALCIUM  8.2* 8.1* 8.1*  MG  --  2.4 2.4    Liver Function Tests: Recent Labs  Lab 08/16/24 1732  AST 22  ALT 9  ALKPHOS 68  BILITOT 0.6  PROT 6.2*  ALBUMIN 2.7*   No results for input(s): LIPASE, AMYLASE in the last 168 hours. No results for input(s): AMMONIA in the last 168 hours.  CBC: Recent Labs  Lab 08/16/24 1732 08/17/24 0740  WBC 8.1 8.1  NEUTROABS 6.6  --   HGB 9.2* 8.3*  HCT 29.1* 26.6*  MCV 97.7 96.4  PLT 298 270    Cardiac Enzymes: No results for input(s): CKTOTAL, CKMB, CKMBINDEX, TROPONINI in the last 168 hours.  BNP: BNP (last 3 results) Recent Labs    06/09/24 2342 07/18/24 1313 08/16/24 1732  BNP 2,216.9* >4,500.0* 4,199.0*    ProBNP (last 3 results) No results for input(s): PROBNP in the last 8760 hours.   CBG: Recent Labs  Lab 08/17/24 0721 08/17/24 1128  08/17/24 1655 08/17/24 2133 08/18/24 0742  GLUCAP 178* 179* 95 160* 124*    Coagulation Studies: No results for input(s): LABPROT, INR in the last 72 hours.  Imaging: DG Chest 2 View Result Date: 08/16/2024 CLINICAL DATA:  Shortness of breath EXAM: CHEST - 2 VIEW COMPARISON:  06/09/2024 FINDINGS: Stable heart and mediastinal contours. Right lung clear. Patchy opacity left lung base concerning for pneumonia. Small left pleural effusion. No pneumothorax or acute bony abnormality. IMPRESSION: Left basilar opacity concerning for pneumonia.  Small left effusion. Electronically Signed   By: Franky Crease M.D.   On: 08/16/2024 18:12     Patient Profile  68 year old with CKD 4, stage D systolic heart failure and ankylosing spondylitis currently admitted with symptomatic palpitations and mild volume overload  Assessment/Plan   Chronic systolic heart failure - Likely nonischemic cardiomyopathy with cardiogram demonstrating EF less than 25%. - Let heart catheterization deferred with CKD4 - Patient reviews cardiac MRI - Appears euvolemic on exam, discontinue IV Lasix .  Restart Lasix  60 mg daily. - Continue carvedilol  12.5 mg twice daily, Jardiance  10 mg daily and hydralazine  50 mg 3 times daily - Will review Zio patch results once available. - Discontinue sodium bicarb tablets in the setting of acute systolic heart failure - Likely stable for discharge in the morning.  - Arranging for AHF follow up next week.  - Will continue to monitor on telemetry through today.   CKD IV - sCr up to 2.98 today; I suspect he is hypovolemic now.  - D/C lasix .    Ria Commander, DO 08/18/2024, 11:31 AM Advanced Heart Failure Team Pager [phone] (M-F; 7a - 5p)  Please contact [facility] for night-coverage after hours (4p -7a ) and weekends on [email or web address]

## 2024-08-18 NOTE — Progress Notes (Signed)
 Progress Note   Patient: Victor Willis FMW:[id number] DOB: 08-09-1956 DOA: 08/16/2024     2 DOS: the patient was seen and examined on 08/18/2024   Brief hospital course: Mr. Victor Willis is a 68 year old male with history of hypertension, non-insulin -dependent diabetes mellitus, hyperlipidemia, heart failure reduced ejection fraction, who presents emergency department for chief concerns of swelling of his legs, dyspnea on exertion. Lab results showed creatinine of 2.81, BNP 4199. X-ray showed a small pleural effusion, left lower lobe opacity concerning for pneumonia.  Procalcitonin level less than 0.1.  Patient was diagnosed with exacerbation congestive heart failure, started on IV Lasix .    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  Patient does not have pneumonia. Condition consistent with acute exacerbation of congestive heart failure. Patient also has a profound elevation BNP, but it is not a good indicator for severity of volume overload due to advanced renal disease. Patient does have significant short of breath  and paroxysmal acute dyspnea before admission, he also has significant edema. He has received IV Lasix , short of breath already improving.  Due to advanced renal disease, GDMT options are limited. Consult to advanced heart failure team obtained.  Will continue another day of IV Lasix .  Likely discharge home tomorrow.     CKD (chronic kidney disease), stage IV (HCC) Metabolic acidosis. Renal function started worsening, but still has volume overload, will continue 1 more day of IV Lasix .     Diabetes mellitus type 2, noninsulin dependent (HCC) Continue sliding scale insulin    Hyperlipidemia Home atorvastatin  40 mg nightly resumed   Essential hypertension Hydralazine  and Coreg .   Chronic pain syndrome Confirmed with daughter that the patient has chronic pain, was taking high-dose hydrocodone.  Restarted.        Subjective:  Patient feels much better today, less short of breath.  No cough.  Physical Exam: Vitals:   08/17/24 2301 08/18/24 0412 08/18/24 0500 08/18/24 0740  BP: 124/86 (!) 137/93  133/84  Pulse: (!) 110 (!) 108  95  Resp: 18 20  18   Temp: 97.7 F (36.5 C) 97.6 F (36.4 C)  97.7 F (36.5 C)  TempSrc: Oral     SpO2: 99% 100%  99%  Weight:   55.9 kg   Height:       General exam: Appears calm and comfortable  Respiratory system: Clear to auscultation. Respiratory effort normal. Cardiovascular system: S1 & S2 heard, RRR. No JVD, murmurs, rubs, gallops or clicks. No pedal edema. Gastrointestinal system: Abdomen is nondistended, soft and nontender. No organomegaly or masses felt. Normal bowel sounds heard. Central nervous system: Alert and oriented. No focal neurological deficits. Extremities: Symmetric 5 x 5 power.  Skin: No rashes, lesions or ulcers Psychiatry: Judgement and insight appear normal. Mood & affect appropriate.    Data Reviewed:  Lab results reviewed  Family Communication: Wife and son updated at bedside.  Disposition: Status is:  Inpatient Remains inpatient appropriate because: Severity of disease, IV treatment     Time spent: 35 minutes  Author: Murvin Mana, MD 08/18/2024 11:33 AM  For on call review [email or web address].

## 2024-08-18 NOTE — Plan of Care (Signed)
  Problem: Coping: Goal: Ability to adjust to condition or change in health will improve Outcome: Progressing   Problem: Nutritional: Goal: Progress toward achieving an optimal weight will improve Outcome: Progressing   Problem: Education: Goal: Knowledge of General Education information will improve Description: Including pain rating scale, medication(s)/side effects and non-pharmacologic comfort measures Outcome: Progressing   Problem: Activity: Goal: Risk for activity intolerance will decrease Outcome: Progressing

## 2024-08-18 NOTE — TOC CM/SW Note (Signed)
 Went by room to complete assessment. Patient stated he did not sleep well last night and was very tired. Patient was agreeable to postponing until later.  Lauraine Carpen, CSW [phone]

## 2024-08-19 ENCOUNTER — Other Ambulatory Visit: Payer: Self-pay

## 2024-08-19 DIAGNOSIS — R002 Palpitations: Secondary | ICD-10-CM

## 2024-08-19 DIAGNOSIS — I471 Supraventricular tachycardia, unspecified: Secondary | ICD-10-CM | POA: Insufficient documentation

## 2024-08-19 DIAGNOSIS — I5023 Acute on chronic systolic (congestive) heart failure: Secondary | ICD-10-CM | POA: Diagnosis not present

## 2024-08-19 DIAGNOSIS — I10 Essential (primary) hypertension: Secondary | ICD-10-CM | POA: Diagnosis not present

## 2024-08-19 DIAGNOSIS — N184 Chronic kidney disease, stage 4 (severe): Secondary | ICD-10-CM | POA: Diagnosis not present

## 2024-08-19 LAB — GLUCOSE, CAPILLARY: Glucose-Capillary: 172 mg/dL — ABNORMAL HIGH (ref 70–99)

## 2024-08-19 LAB — CBC
HCT: 30.7 % — ABNORMAL LOW (ref 39.0–52.0)
Hemoglobin: 9.5 g/dL — ABNORMAL LOW (ref 13.0–17.0)
MCH: 29.8 pg (ref 26.0–34.0)
MCHC: 30.9 g/dL (ref 30.0–36.0)
MCV: 96.2 fL (ref 80.0–100.0)
Platelets: 302 K/uL (ref 150–400)
RBC: 3.19 MIL/uL — ABNORMAL LOW (ref 4.22–5.81)
RDW: 14.9 % (ref 11.5–15.5)
WBC: 9.5 K/uL (ref 4.0–10.5)
nRBC: 0 % (ref 0.0–0.2)

## 2024-08-19 LAB — BASIC METABOLIC PANEL WITH GFR
Anion gap: 12 (ref 5–15)
BUN: 71 mg/dL — ABNORMAL HIGH (ref 8–23)
CO2: 21 mmol/L — ABNORMAL LOW (ref 22–32)
Calcium: 8 mg/dL — ABNORMAL LOW (ref 8.9–10.3)
Chloride: 103 mmol/L (ref 98–111)
Creatinine, Ser: 2.92 mg/dL — ABNORMAL HIGH (ref 0.61–1.24)
GFR, Estimated: 23 mL/min — ABNORMAL LOW (ref >60)
Glucose, Bld: 124 mg/dL — ABNORMAL HIGH (ref 70–99)
Potassium: 4.2 mmol/L (ref 3.5–5.1)
Sodium: 136 mmol/L (ref 135–145)

## 2024-08-19 LAB — MAGNESIUM: Magnesium: 2.5 mg/dL — ABNORMAL HIGH (ref 1.7–2.4)

## 2024-08-19 MED ORDER — AMIODARONE HCL 200 MG PO TABS: 200.0000 mg | ORAL_TABLET | Freq: Every day | ORAL | Status: DC

## 2024-08-19 MED ORDER — HYDRALAZINE HCL 50 MG PO TABS
50.0000 mg | ORAL_TABLET | Freq: Two times a day (BID) | ORAL | 0 refills | Status: DC
  Filled 2024-08-19: qty 60, 30d supply, fill #0

## 2024-08-19 MED ORDER — AMIODARONE HCL 200 MG PO TABS
400.0000 mg | ORAL_TABLET | Freq: Two times a day (BID) | ORAL | Status: DC
Start: 2024-08-19 — End: 2024-08-19

## 2024-08-19 MED ORDER — AMIODARONE HCL 200 MG PO TABS
ORAL_TABLET | ORAL | 0 refills | Status: DC
  Filled 2024-08-19: qty 50, 35d supply, fill #0

## 2024-08-19 MED ORDER — AMIODARONE HCL 200 MG PO TABS: 400.0000 mg | ORAL_TABLET | Freq: Two times a day (BID) | ORAL | Status: DC

## 2024-08-19 MED ORDER — FUROSEMIDE 40 MG PO TABS: 60.0000 mg | ORAL_TABLET | Freq: Every day | ORAL | Status: AC

## 2024-08-19 NOTE — Progress Notes (Signed)
 Pt states that His oxyCODONE  is PRN at home, he only wants to take 15mg  at this moment/ wasted 15mg  with charge nurse Eleonor. I will notify day shift nurse and Dr. Laurita via private messaging.

## 2024-08-19 NOTE — Progress Notes (Addendum)
 Advanced Heart Failure Rounding Note  Cardiologist: Lonni Hanson, MD  AHF: Dr. Cherrie  Chief Complaint: acute on chronic systolic heart failure and palpitations    Patient Profile:    68 year old with CKD 4, stage D systolic heart failure and ankylosing spondylitis currently admitted with symptomatic palpitations and mild volume overload.   Subjective:    1Lin UOP yesterday. Net negative 1.6L since admit.   Scr relatively stable at 2.9, BUN up 65>>67>>71 K 4.2   Says he feels better. Denies resting dyspnea. No exertional dyspnea ambulating around room. Says he is sleeping better w/ no orthopnea. He is eager to get discharged home today.   ReDs 32%, normal   Objective:   Weight Range: 55.2 kg Body mass index is 23.75 kg/m.   Vital Signs:   Temp:  [97.6 F (36.4 C)-98.4 F (36.9 C)] 97.9 F (36.6 C) (09/09 0409) Pulse Rate:  [40-93] 40 (09/08 2015) Resp:  [16-18] 17 (09/09 0409) BP: (104-144)/(55-86) 144/86 (09/09 0409) SpO2:  [96 %-100 %] 100 % (09/09 0409) Weight:  [55.2 kg] 55.2 kg (09/09 0519) Last BM Date : 08/18/24  Weight change: Filed Weights   08/16/24 1729 08/18/24 0500 08/19/24 0519  Weight: 54.4 kg 55.9 kg 55.2 kg    Intake/Output:   Intake/Output Summary (Last 24 hours) at 08/19/2024 0743 Last data filed at 08/19/2024 0405 Gross per 24 hour  Intake 480 ml  Output 1000 ml  Net -520 ml      Physical Exam   GENERAL: thin male, sitting up in chair. NAD Lungs- clear  CARDIAC:  JVP:  7c m         Normal rate with regular rhythm. No edema  ABDOMEN: Soft, non-tender, non-distended.  EXTREMITIES: Warm and well perfused.  NEUROLOGIC: No obvious FND   Telemetry   NSR 70s + PACs, PVCs, run of SVT and NSVT (5 beats)   EKG    N/A   Labs    CBC Recent Labs    08/16/24 1732 08/17/24 0740 08/19/24 0433  WBC 8.1 8.1 9.5  NEUTROABS 6.6  --   --   HGB 9.2* 8.3* 9.5*  HCT 29.1* 26.6* 30.7*  MCV 97.7 96.4 96.2  PLT 298 270 302    Basic Metabolic Panel Recent Labs    90/91/74 0440 08/19/24 0433  NA 136 136  K 4.5 4.2  CL 106 103  CO2 19* 21*  GLUCOSE 132* 124*  BUN 67* 71*  CREATININE 2.98* 2.92*  CALCIUM  8.1* 8.0*  MG 2.4 2.5*   Liver Function Tests Recent Labs    08/16/24 1732  AST 22  ALT 9  ALKPHOS 68  BILITOT 0.6  PROT 6.2*  ALBUMIN 2.7*   No results for input(s): LIPASE, AMYLASE in the last 72 hours. Cardiac Enzymes No results for input(s): CKTOTAL, CKMB, CKMBINDEX, TROPONINI in the last 72 hours.  BNP: BNP (last 3 results) Recent Labs    06/09/24 2342 07/18/24 1313 08/16/24 1732  BNP 2,216.9* >4,500.0* 4,199.0*    ProBNP (last 3 results) No results for input(s): PROBNP in the last 8760 hours.   D-Dimer No results for input(s): DDIMER in the last 72 hours. Hemoglobin A1C Recent Labs    08/17/24 0740  HGBA1C 6.0*   Fasting Lipid Panel No results for input(s): CHOL, HDL, LDLCALC, TRIG, CHOLHDL, LDLDIRECT in the last 72 hours. Thyroid  Function Tests No results for input(s): TSH, T4TOTAL, T3FREE, THYROIDAB in the last 72 hours.  Invalid input(s): FREET3  Other results:  Imaging    No results found.   Medications:     Scheduled Medications:  aspirin   81 mg Oral Daily   atorvastatin   40 mg Oral QHS   carvedilol   12.5 mg Oral BID WC   empagliflozin   10 mg Oral Daily   furosemide   60 mg Oral Daily   heparin   5,000 Units Subcutaneous Q8H   hydrALAZINE   50 mg Oral BID   insulin  aspart  0-5 Units Subcutaneous QHS   insulin  aspart  0-9 Units Subcutaneous TID WC   oxyCODONE   30 mg Oral QID    Infusions:   PRN Medications: acetaminophen  **OR** acetaminophen , hydrALAZINE , metoprolol  tartrate, ondansetron  **OR** ondansetron  (ZOFRAN ) IV, senna-docusate   Assessment/Plan   1. Acute Chronic systolic heart failure - Likely nonischemic cardiomyopathy with echo cardiogram demonstrating EF less than 25%. - Let heart  catheterization deferred with CKD4 - Patient refused cardiac MRI - Reports symptomatic improvement w/ diuresis. Appears euvolemic on exam. ReDs 32%, normal  - Continue carvedilol  12.5 mg twice daily - Continue Jardiance  10 mg daily  - Continue hydralazine  50 mg 3 times daily - no ARB/ARNi, MRA nor digoxin w/ CKD IV  - Transition to PO Lasix  60 mg daily  - will arrange hospital f/u next wk   2. Palpitations - outpatient 14 day Zio in progress - captured SVT on tele this admit, frequent PACs - start amio 400 mg bid x 5 days>>200 mg daily    3. CKD IV - baseline SCr ~2.4-2.7  - sCr up to 2.92 w/ diuresis, stable past 48 hr  - on Jardiance    Ok to d/c home today from HF standpoint.    Length of Stay: [address] Marcine RIGGERS  08/19/2024, 7:43 AM  Advanced Heart Failure Team Pager [phone] (M-F; 7a - 5p)  Please contact [facility] for night-coverage after hours (5p -7a ) and weekends on [email or web address]

## 2024-08-19 NOTE — Progress Notes (Signed)
 Nsg Discharge Note  Admit Date:  08/16/2024 Discharge date: 08/19/2024   Victor Willis to be D/C'd Home per MD order.  AVS completed.  Copy for chart, and copy for patient signed, and dated. Patient/caregiver able to verbalize understanding.  IV catheter discontinued intact. Site without signs and symptoms of complications - no redness or edema noted at insertion site, patient denies c/o pain - only slight tenderness at site.  Dressing with slight pressure applied.  D/c Instructions-Education: Discharge instructions given to patient/family with verbalized understanding. D/c education completed with patient/family including follow up instructions, medication list, d/c activities limitations if indicated, with other d/c instructions as indicated by MD - patient able to verbalize understanding, all questions fully answered. Patient instructed to return to ED, call 911, or call MD for any changes in condition.  Patient escorted via WC, and D/C home via private auto with spouse.

## 2024-08-19 NOTE — Progress Notes (Signed)
 ReDS Vest / Clip - 08/19/24 0900       ReDS Vest / Clip   Station Marker A    Ruler Value 30    ReDS Value Range Low volume    ReDS Actual Value 32          Charmaine Pines, RN, BSN [facility] Heart Failure Navigator Secure Chat Only

## 2024-08-19 NOTE — Discharge Summary (Signed)
 Physician Discharge Summary   Patient: Victor Willis MRN: [id number] DOB: Nov 17, 1956  Admit date:     08/16/2024  Discharge date: 08/19/24  Discharge Physician: Murvin Mana   PCP: Diedra Lame, MD   Recommendations at discharge:   Follow-up with PCP in 1 week. Follow-up with cardiology as scheduled  Discharge Diagnoses: Principal Problem:   Acute exacerbation of CHF (congestive heart failure) (HCC) Active Problems:   Ankylosing spondylitis (HCC)   Essential hypertension   Tobacco abuse   Fluid overload   CKD (chronic kidney disease), stage IV (HCC)   Shortness of breath   Acute on chronic systolic CHF (congestive heart failure) (HCC)   Hyperlipidemia   Diabetes mellitus type 2, noninsulin dependent (HCC)   Heart failure with reduced ejection fraction (HFrEF, <= 40%) and combined systolic and diastolic dysfunction (HCC)   Moderate pulmonary hypertension (HCC)   PSVT (paroxysmal supraventricular tachycardia) (HCC)  Resolved Problems:   * No resolved hospital problems. [place] [facility] Course: Victor Willis is a 68 year old male with history of hypertension, non-insulin -dependent diabetes mellitus, hyperlipidemia, heart failure reduced ejection fraction, who presents emergency department for chief concerns of swelling of his legs, dyspnea on exertion. Lab results showed creatinine of 2.81, BNP 4199. X-ray showed a small pleural effusion, left lower lobe opacity concerning for pneumonia.  Procalcitonin level less than 0.1.  Patient was diagnosed with exacerbation congestive heart failure, started on IV Lasix . Followed by cardiology, condition has improved.  Volume status improved.  Medically stable for discharge today.  Assessment and Plan: * Acute exacerbation of systolic CHF (congestive heart failure) (HCC) Moderate pulm hypertension Elevated troponin due to demand ischemia PSVT Complete echo on 05/28/2024 was read as estimate ejection fraction less than 20%, grade 2  diastolic dysfunction Patient chest x-ray showed showed left lower lobe opacity, but procalcitonin level less than 0.1.  Patient does not have pneumonia. Condition consistent with acute exacerbation of congestive heart failure. Patient also has a profound elevation BNP, but it is not a good indicator for severity of volume overload due to advanced renal disease. Patient does have significant short of breath and paroxysmal nocturnal dyspnea before admission, he also has significant edema. He has received IV Lasix , short of breath already improving.  Due to advanced renal disease, GDMT options are limited. Patient is seen by cardiology, volume status much improved.  Medically stable for discharge.  Patient to be followed by cardiology and PCP as outpatient. Patient had significant tachycardia prior to worsening symptoms.  He had a episode of PSVT.  Cardiology has started on amiodarone .  Continue beta-blocker     CKD (chronic kidney disease), stage IV (HCC) Metabolic acidosis. Renal function improved.  Metabolic acidosis largely improved.   Diabetes mellitus type 2, noninsulin dependent (HCC) Continue sliding scale insulin    Hyperlipidemia Home atorvastatin  40 mg nightly resumed   Essential hypertension Hydralazine  and Coreg .   Chronic pain syndrome Confirmed with daughter that the patient has chronic pain, was taking high-dose hydrocodone.  Restarted.             Consultants: Cardiology. Procedures performed: None  Disposition: Home Diet recommendation:  Discharge Diet Orders (From admission, onward)     Start     Ordered   08/19/24 0000  Diet - low sodium heart healthy        08/19/24 1048           Cardiac diet DISCHARGE MEDICATION: Allergies as of 08/19/2024       Reactions  Celecoxib Rash, Anaphylaxis, Dermatitis   Morphine  Other (See Comments)   PT STATE IT MAKE HIM FEEL WAY TOO LOOPY, CAN TOLERATE DILAUDID  VERY WELL     PT CAN ALSO TOLERATE HYDROCODONE VERY  WELL   Prednisone Other (See Comments)   Throat swelling per patient        Medication List     TAKE these medications    amiodarone  200 MG tablet Commonly known as: PACERONE  Take 2 tablets (400 mg total) by mouth 2 (two) times daily for 5 days, THEN 1 tablet (200 mg total) daily. Start taking on: August 19, 2024   aspirin  81 MG chewable tablet Chew 81 mg by mouth daily.   atorvastatin  40 MG tablet Commonly known as: Lipitor Take 1 tablet (40 mg total) by mouth daily.   carvedilol  12.5 MG tablet Commonly known as: COREG  Take 12.5 mg by mouth 2 (two) times daily with a meal.   Cholecalciferol 125 MCG (5000 UT) Tabs Take 5,000 Units by mouth daily.   cyclobenzaprine 10 MG tablet Commonly known as: FLEXERIL Take 10 mg by mouth 2 (two) times daily.   furosemide  40 MG tablet Commonly known as: LASIX  Take 1.5 tablets (60 mg total) by mouth daily. What changed: how much to take   hydrALAZINE  50 MG tablet Commonly known as: APRESOLINE  Take 1 tablet (50 mg total) by mouth 2 (two) times daily. What changed:  medication strength how much to take when to take this   Jardiance  10 MG Tabs tablet Generic drug: empagliflozin  Take 1 tablet (10 mg total) by mouth daily before breakfast.   Multi-Vitamin tablet Take 1 tablet by mouth daily.   oxycodone  30 MG immediate release tablet Commonly known as: ROXICODONE  Take 30 mg by mouth 4 (four) times daily.   sulfaSALAzine  500 MG tablet Commonly known as: AZULFIDINE  Take 500 mg by mouth 2 (two) times daily.        Follow-up Information     Rolan Ezra RAMAN, MD. Go on 08/25/2024.   Specialty: Cardiology Why: Hospital Follow-Up 08/25/24 @ 3:45 Please bring all medications to follow-up appointment [facility] [address] Second Floor Free Valet Parking at the door Contact information: [address] [place] [address] [phone]         Diedra Lame, MD Follow up in 1 week(s).    Specialty: Family Medicine Contact information: [address] [facility] [place] [address] [phone]                Discharge Exam: Filed Weights   08/16/24 1729 08/18/24 0500 08/19/24 0519  Weight: 54.4 kg 55.9 kg 55.2 kg   General exam: Appears calm and comfortable  Respiratory system: Clear to auscultation. Respiratory effort normal. Cardiovascular system: S1 & S2 heard, RRR. No JVD, murmurs, rubs, gallops or clicks. No pedal edema. Gastrointestinal system: Abdomen is nondistended, soft and nontender. No organomegaly or masses felt. Normal bowel sounds heard. Central nervous system: Alert and oriented. No focal neurological deficits. Extremities: Symmetric 5 x 5 power. Skin: No rashes, lesions or ulcers Psychiatry: Judgement and insight appear normal. Mood & affect appropriate.    Condition at discharge: good  The results of significant diagnostics from this hospitalization (including imaging, microbiology, ancillary and laboratory) are listed below for reference.   Imaging Studies: DG Chest 2 View Result Date: 08/16/2024 CLINICAL DATA:  Shortness of breath EXAM: CHEST - 2 VIEW COMPARISON:  06/09/2024 FINDINGS: Stable heart and mediastinal  contours. Right lung clear. Patchy opacity left lung base concerning for pneumonia. Small left pleural effusion. No pneumothorax or acute bony abnormality. IMPRESSION: Left basilar opacity concerning for pneumonia.  Small left effusion. Electronically Signed   By: Franky Crease M.D.   On: 08/16/2024 18:12    Microbiology: Results for orders placed or performed during the hospital encounter of 10/29/19  SARS CORONAVIRUS 2 (TAT 6-24 HRS) Nasopharyngeal Nasopharyngeal Swab     Status: None   Collection Time: 10/29/19  3:56 AM   Specimen: Nasopharyngeal Swab  Result Value Ref Range Status   SARS Coronavirus 2 NEGATIVE NEGATIVE Final    Comment: (NOTE) SARS-CoV-2 target nucleic acids are  NOT DETECTED. The SARS-CoV-2 RNA is generally detectable in upper and lower respiratory specimens during the acute phase of infection. Negative results do not preclude SARS-CoV-2 infection, do not rule out co-infections with other pathogens, and should not be used as the sole basis for treatment or other patient management decisions. Negative results must be combined with clinical observations, patient history, and epidemiological information. The expected result is Negative. Fact Sheet for Patients: HairSlick.no Fact Sheet for Healthcare Providers: quierodirigir.com This test is not yet approved or cleared by the [place]  FDA and  has been authorized for detection and/or diagnosis of SARS-CoV-2 by FDA under an Emergency Use Authorization (EUA). This EUA will remain  in effect (meaning this test can be used) for the duration of the COVID-19 declaration under Section 56 4(b)(1) of the Act, 21 U.S.C. section 360bbb-3(b)(1), unless the authorization is terminated or revoked sooner. Performed at [facility], [address] [place], [place] [address]     Labs: CBC: Recent Labs  Lab 08/16/24 1732 08/17/24 0740 08/19/24 0433  WBC 8.1 8.1 9.5  NEUTROABS 6.6  --   --   HGB 9.2* 8.3* 9.5*  HCT 29.1* 26.6* 30.7*  MCV 97.7 96.4 96.2  PLT 298 270 302   Basic Metabolic Panel: Recent Labs  Lab 08/16/24 1732 08/17/24 0740 08/18/24 0440 08/19/24 0433  NA 135 136 136 136  K 4.2 4.3 4.5 4.2  CL 103 104 106 103  CO2 20* 19* 19* 21*  GLUCOSE 206* 169* 132* 124*  BUN 61* 65* 67* 71*  CREATININE 2.81* 2.87* 2.98* 2.92*  CALCIUM  8.2* 8.1* 8.1* 8.0*  MG  --  2.4 2.4 2.5*   Liver Function Tests: Recent Labs  Lab 08/16/24 1732  AST 22  ALT 9  ALKPHOS 68  BILITOT 0.6  PROT 6.2*  ALBUMIN 2.7*   CBG: Recent Labs  Lab 08/18/24 0742 08/18/24 1137 08/18/24 1726 08/18/24 2027 08/19/24 0743  GLUCAP 124* 327*  154* 128* 172*    Discharge time spent: 40 minutes.  Signed: Murvin Mana, MD [facility] 08/19/2024

## 2024-08-19 NOTE — Progress Notes (Signed)
 Patient holding 2 oxycodone  pills in his hand. Asked patient if he had taken all his pills. Pt stated yes. Asked patient if he still had pills in his hand, Pt stated no I took them all. Asked patient to show his hands. Pt turned over his hands and attempted to hide pills on his leg. Moved patient's hand and asked why he did not take the last 2 pills. Pt states I dont want them anymore. Educated patient that is fine, but he needs to be honest with staff and we will return the extra pills. Pt verbalizes understanding. Pt's SO at bedside at this time.

## 2024-08-19 NOTE — TOC Progression Note (Signed)
 Transition of Care [facility]) - Progression Note    Patient Details  Name: Victor Willis MRN: [id number] Date of Birth: 1956/10/02  Transition of [facility]) CM/SW Contact  Alfonso Rummer, LCSW Phone Number: 08/19/2024, 11:26 AM  Clinical Narrative:   LCSW A. Rummer completed readmission screen with pt [name] and spouse at bedside. [name] reports he still drives and does not have any barriers impacting his ability to drive to follow up medical appts, pharmacy of choice [facility] and family is supportive. [name] does not use durable medical equipment, no history of home health and income is social security disability. No identified TOC needs identified.                      Expected Discharge Plan and Services         Expected Discharge Date: 08/19/24                                     Social Drivers of Health (SDOH) Interventions SDOH Screenings   Food Insecurity: No Food Insecurity (08/17/2024)  Housing: Low Risk  (08/17/2024)  Transportation Needs: No Transportation Needs (08/17/2024)  Utilities: Not At Risk (08/17/2024)  Financial Resource Strain: Patient Declined (04/09/2023)   [facility] from [facility]  Social Connections: Moderately Isolated (08/17/2024)  Tobacco Use: Medium Risk (08/16/2024)    Readmission Risk Interventions     No data to display

## 2024-08-19 NOTE — TOC Transition Note (Signed)
 Transition of Care [facility]) - Discharge Note   Patient Details  Name: Victor Willis MRN: [id number] Date of Birth: 1956-07-10  Transition [facility]) CM/SW Contact:  Alfonso Rummer, LCSW Phone Number: 08/19/2024, 11:45 AM   Clinical Narrative:    LCSW A. Rummer completed readmission screen with pt T. Harling and spouse at bedside. Mr. Oguinn reports he still drives and does not have any barriers impacting his ability to drive to follow up medical appts, [facility] choice [facility] and family is supportive. Mr. Macdougal does not use durable medical equipment, no history of home health and income is social security disability. No identified TOC needs identified.           Patient Goals and CMS Choice            Discharge Placement                       Discharge Plan and Services Additional resources added to the After Visit Summary for                                       Social Drivers of Health (SDOH) Interventions SDOH Screenings   Food Insecurity: No Food Insecurity (08/17/2024)  Housing: Low Risk  (08/17/2024)  Transportation Needs: No Transportation Needs (08/17/2024)  Utilities: Not At Risk (08/17/2024)  Financial Resource Strain: Patient Declined (04/09/2023)   Received from [facility]  Social Connections: Moderately Isolated (08/17/2024)  Tobacco Use: Medium Risk (08/16/2024)     Readmission Risk Interventions     No data to display

## 2024-08-19 NOTE — Progress Notes (Signed)
Refuses VS.  

## 2024-08-22 ENCOUNTER — Telehealth: Payer: Self-pay | Admitting: Cardiology

## 2024-08-22 NOTE — Telephone Encounter (Signed)
 Called to confirm/remind patient of their appointment at the [facility] on 08/25/24.   Appointment:   [x] Confirmed  [] Left mess   [] No answer/No voice mail  [] VM Full/unable to leave message  [] Phone not in service  Patient reminded to bring all medications and/or complete list.  Confirmed patient has transportation. Gave directions, instructed to utilize valet parking.

## 2024-08-25 ENCOUNTER — Encounter: Admitting: Cardiology

## 2024-08-26 ENCOUNTER — Encounter: Admitting: Cardiology

## 2024-08-29 NOTE — Addendum Note (Signed)
 Encounter addended by: Debarah Garrison MATSU, RN on: 08/29/2024 10:37 AM  Actions taken: Imaging Exam ended

## 2024-09-18 ENCOUNTER — Telehealth: Payer: Self-pay | Admitting: Family

## 2024-09-18 NOTE — Telephone Encounter (Signed)
 Called to confirm/remind patient of their appointment at the [facility] on 09/19/24.   Appointment:   [] Confirmed  [x] Left mess   [] No answer/No voice mail  [] VM Full/unable to leave message  [] Phone not in service  Patient reminded to bring all medications and/or complete list.  Confirmed patient has transportation. Gave directions, instructed to utilize valet parking.

## 2024-09-18 NOTE — Progress Notes (Unsigned)
 ADVANCED HEART FAILURE NEW PATIENT CLINIC NOTE   Referring Physician: Diedra Lame, MD  Primary Care: Victor Lame, MD Primary Cardiologist: End, Lonni, MD/ Victor Willis, [place]  Chief Complaint: shortness of breath   HPI:  Victor Willis is a 68 y.o. male with a PMH of CKD IV, ankylosing spondylitis, hypertension, recent admission for acute systolic heart faliure who presents for initial visit for further evaluation and treatment of heart failure/cardiomyopathy.    Patient presented to [facility] ED [facility]/26/2024 with initial complaints of shortness of breath and lower extremity swelling.  Echo done and showed severely reduced LV function less than 20% with dilated LV.  He was aggressively diuresed with IV Lasix . Advanced heart failure was consulted and patient underwent right heart cath 6/28 which showed low filling pressures and cardiac index of 2.5 L/min/m.  Achieved euvolemic state and was discharged on 05/31/2024.   Seen in [facility] 06/25 where carvedilol  3.125mg  BID & atorvastatin  40mg  were started. ASA stopped.    Was in the ED 06/09/24 due to tachycardia and elevated blood pressure. Was concerned that recent med changes caused this. Symptoms resolved by the time EDP saw patient. Hemoglobin slightly lower, renal function slightly better. Troponins stable. CXR and BNP improved.   SUBJECTIVE:  He presents today for a HF follow-up visit with [name]. Says he is beginning to feel better. Less fatigue. (Was sleeping all the time before). Able to do ADLs. Breathing ok. Swelling getting under better control (has been in HF 2x with volume overload since d/c)  No CP. Taking lasix  40 daily. About 5 days ago had a 2 hour episode of tachypalpitations and HTN. Resolved spontaneously.    PMH, current medications, allergies, social history, and family history reviewed in epic.  ROS: All systems negative except what is listed in HPI, PMH and Problem List   PHYSICAL  EXAM:  General: Well appearing. Stiff neck due to ankylosing spondylitis Cor: Elevated JVD. Regular rhythm, tachycardic.  Lungs: clear Abdomen: soft, nontender, nondistended. Extremities: trace pitting edema right lower leg, 1+ pitting left lower leg Neuro:. Affect pleasant, anxious  There were no vitals filed for this visit.  Wt Readings from Last 3 Encounters:  08/19/24 121 lb 9.6 oz (55.2 kg)  08/05/24 123 lb 4 oz (55.9 kg)  07/18/24 127 lb (57.6 kg)   Lab Results  Component Value Date   CREATININE 2.92 (H) 08/19/2024   CREATININE 2.98 (H) 08/18/2024   CREATININE 2.87 (H) 08/17/2024     ReDs reading: 39 %, abnormal (see plan below)   ASSESSMENT & PLAN:  1. Chronic systolic heart failure  - Diagnosed 6/25. Echo EF < 25% - No cath with CKD IV - Refused cMRI - Stable/improved NYHA Willis II-III - Volume ok at 39% Detailed discussion about using sliding-scale diuretics - Continue carvedilol  12.5mg  BID - Continue furosemide  40mg  daily.  - Continue hydralazine  25mg  TID - Continue jardiance  10mg  daily.  - No MRA or RAASi due to CKD IV - No room to titrate GDMT - Recent labs with Nephrology - Refer CR - Not ICD canddiate  2. Hypertension: - Improved with GDMT - Do not want to push BP down much lower with CKD IV  3. CAD - Coronary calcium  noted on CT scan - No role for cath with CKD IV and no angina - Followed by General Cardiology. Continue atorvastatin  40mg  daily  4. CKD IV - Follows with Nephrology Victor Willis)  - continue jardiance  10mg  daily  5. Tachypalpitations - place 14-day  Zio XT  6. Alcohol and tobacco abuse - No alcohol since admission 06/25 - No smoking since admission 06/25. Does continue to use nicotine  patches  Victor DELENA Class, FNP  2:24 PM

## 2024-09-19 ENCOUNTER — Encounter: Payer: Self-pay | Admitting: Family

## 2024-09-19 ENCOUNTER — Ambulatory Visit: Admitting: Nurse Practitioner

## 2024-09-19 ENCOUNTER — Ambulatory Visit: Attending: Family | Admitting: Family

## 2024-09-19 VITALS — BP 136/96 | HR 81 | Wt 125.6 lb

## 2024-09-19 DIAGNOSIS — G479 Sleep disorder, unspecified: Secondary | ICD-10-CM | POA: Diagnosis not present

## 2024-09-19 DIAGNOSIS — I5022 Chronic systolic (congestive) heart failure: Secondary | ICD-10-CM | POA: Insufficient documentation

## 2024-09-19 DIAGNOSIS — I2510 Atherosclerotic heart disease of native coronary artery without angina pectoris: Secondary | ICD-10-CM | POA: Insufficient documentation

## 2024-09-19 DIAGNOSIS — M459 Ankylosing spondylitis of unspecified sites in spine: Secondary | ICD-10-CM | POA: Diagnosis not present

## 2024-09-19 DIAGNOSIS — Z79899 Other long term (current) drug therapy: Secondary | ICD-10-CM | POA: Insufficient documentation

## 2024-09-19 DIAGNOSIS — N184 Chronic kidney disease, stage 4 (severe): Secondary | ICD-10-CM | POA: Diagnosis not present

## 2024-09-19 DIAGNOSIS — F1011 Alcohol abuse, in remission: Secondary | ICD-10-CM | POA: Diagnosis not present

## 2024-09-19 DIAGNOSIS — I429 Cardiomyopathy, unspecified: Secondary | ICD-10-CM | POA: Insufficient documentation

## 2024-09-19 DIAGNOSIS — I5020 Unspecified systolic (congestive) heart failure: Secondary | ICD-10-CM | POA: Diagnosis present

## 2024-09-19 DIAGNOSIS — I130 Hypertensive heart and chronic kidney disease with heart failure and stage 1 through stage 4 chronic kidney disease, or unspecified chronic kidney disease: Secondary | ICD-10-CM | POA: Diagnosis not present

## 2024-09-19 DIAGNOSIS — I10 Essential (primary) hypertension: Secondary | ICD-10-CM

## 2024-09-19 DIAGNOSIS — Z720 Tobacco use: Secondary | ICD-10-CM | POA: Diagnosis not present

## 2024-09-19 DIAGNOSIS — R002 Palpitations: Secondary | ICD-10-CM

## 2024-09-19 MED ORDER — HYDRALAZINE HCL 50 MG PO TABS: 50.0000 mg | ORAL_TABLET | Freq: Two times a day (BID) | ORAL | 3 refills | Status: AC

## 2024-09-19 MED ORDER — AMIODARONE HCL 200 MG PO TABS: 200.0000 mg | ORAL_TABLET | Freq: Every day | ORAL | 3 refills | Status: AC

## 2024-09-19 NOTE — Patient Instructions (Addendum)
 It was good to see you today!  If you receive a satisfaction survey regarding the Heart Failure Clinic, please take the time to fill it out. This way we can continue to provide excellent care and make any changes that need to be made.

## 2024-10-08 ENCOUNTER — Encounter: Admitting: Family

## 2024-10-10 ENCOUNTER — Telehealth: Payer: Self-pay | Admitting: Family

## 2024-10-10 NOTE — Telephone Encounter (Signed)
 Called to confirm/remind patient of their appointment at the [facility] on 10/13/24.   Appointment:   [] Confirmed  [x] Left mess   [] No answer/No voice mail  [] VM Full/unable to leave message  [] Phone not in service  Patient reminded to bring all medications and/or complete list.  Confirmed patient has transportation. Gave directions, instructed to utilize valet parking.

## 2024-10-12 NOTE — Progress Notes (Incomplete)
 ADVANCED HEART FAILURE NEW PATIENT CLINIC NOTE   Referring Physician: Diedra Lame, MD  Primary Care: Victor Lame, MD Primary Cardiologist: End, Lonni, MD/ Victor Willis, [place]  Chief Complaint: shortness of breath   HPI:  Victor Willis is a 68 y.o. male with a PMH of CKD IV, ankylosing spondylitis, hypertension, recent admission for acute systolic heart faliure who presents for initial visit for further evaluation and treatment of heart failure/cardiomyopathy.    Patient presented to [facility] ED 05/26/2024 with initial complaints of shortness of breath and lower extremity swelling.  Echo done and showed severely reduced LV function less than 20% with dilated LV.  He was aggressively diuresed with IV Lasix . Advanced heart failure was consulted and patient underwent right heart cath 6/28 which showed low filling pressures and cardiac index of 2.5 L/min/m.  Achieved euvolemic state and was discharged on 05/31/2024.   Seen in [facility] 06/25 where carvedilol  3.125mg  BID & atorvastatin  40mg  were started. ASA stopped.    Was in the ED 06/09/24 due to tachycardia and elevated blood pressure. Was concerned that recent med changes caused this. Symptoms resolved by the time EDP saw patient. Hemoglobin slightly lower, renal function slightly better. Troponins stable. CXR and BNP improved.   Admitted 08/16/24 with increased shortness of breath and pedal edema. Lab results showed creatinine of 2.81, BNP 4199. X-ray showed a small pleural effusion, left lower lobe opacity concerning for pneumonia.  Procalcitonin level less than 0.1. IV diuresed with improvement of symptoms. Cardiology consulted due to an episode of PSVT and was started on amiodarone .    SUBJECTIVE:  He presents today for a hospital HF follow-up visit, with his wife, with a chief complaint of minimal shortness of breath. Has associated fatigue, cold feeling. Chronically difficulty sleeping. Denies chest pain, palpitations, abdominal  distention, pedal edema. Saw nephrology yesterday and had labs done. Says that he's being referred to the cancer center for a shot to help blood count.   PMH, current medications, allergies, social history, and family history reviewed in epic.  ROS: All systems negative except what is listed in HPI, PMH and Problem List   PHYSICAL EXAM:  General: Well appearing.  Cor: No JVD. Regular rhythm, rate.  Lungs: clear Abdomen: soft, nontender, nondistended. Extremities: no edema Neuro:. Affect pleasant  There were no vitals filed for this visit.  Wt Readings from Last 3 Encounters:  09/19/24 57 kg  08/19/24 55.2 kg  08/05/24 55.9 kg   Lab Results  Component Value Date   CREATININE 2.92 (H) 08/19/2024   CREATININE 2.98 (H) 08/18/2024   CREATININE 2.87 (H) 08/17/2024    ASSESSMENT & PLAN:  1. Chronic systolic heart failure  - Diagnosed 6/25. Echo EF < 25% - No cath with CKD IV - Refused cMRI - Stable/improved NYHA class II - weight up 2 pounds from last visit here 6 weeks ago - Continue carvedilol  12.5mg  BID - Continue furosemide  60mg  daily.  - Continue hydralazine  50mg  BID - Continue jardiance  10mg  daily.  - No MRA, RAASi, digoxin due to CKD IV - Recent labs with Nephrology - Not ICD canddiate  2. Hypertension: - BP 136/96 - BMET 08/19/24 reviewed: sodium 136, potassium 4.2, creatinine 2.52, GFR 23. Had labs with nephrology yesterday but no results yet  3. CAD - Coronary calcium  noted on CT scan - No role for cath with CKD IV and no angina - Followed by General Cardiology.  - Continue atorvastatin  40mg  daily - LDL 05/21/24 was 94  4. CKD  IV - Follows with Nephrology Victor Willis), seen 10/25 - continue jardiance  10mg  daily - Hg 08/19/24 was 9.5  5. Tachypalpitations - zio results pending - continue amiodarone  200mg  daily - will need yearly eye exam - TSH 06/09/24 was 4.810. Recheck this next visit since he's now on amiodarone   6. Alcohol and tobacco abuse - No  alcohol since admission 06/25 - No smoking since admission 06/25. Does continue to use nicotine  patches   Return in 2 weeks at already scheduled appointment, sooner if needed.   I spent 33 minutes reviewing records, interviewing/ examing patient and managing plan/ orders.    Victor Blumenthal, RN  11:26 PM

## 2024-10-12 NOTE — Progress Notes (Signed)
 ADVANCED HEART FAILURE NEW PATIENT CLINIC NOTE   Referring Physician: Diedra Lame, MD  Primary Care: Diedra Lame, MD Primary Cardiologist: End, Lonni, MD/ Lorene Sinclair, [place]  Chief Complaint: shortness of breath   HPI:  Victor Willis is a 68 y.o. male with a PMH of CKD IV, ankylosing spondylitis, hypertension, recent admission for acute systolic heart faliure who presents for initial visit for further evaluation and treatment of heart failure/cardiomyopathy.    Patient presented to [facility] ED 05/26/2024 with initial complaints of shortness of breath and lower extremity swelling.  Echo done and showed severely reduced LV function less than 20% with dilated LV.  He was aggressively diuresed with IV Lasix . Advanced heart failure was consulted and patient underwent right heart cath 6/28 which showed low filling pressures and cardiac index of 2.5 L/min/m.  Achieved euvolemic state and was discharged on 05/31/2024.   Seen in [facility] 06/25 where carvedilol  3.125mg  BID & atorvastatin  40mg  were started. ASA stopped.    Was in the ED 06/09/24 due to tachycardia and elevated blood pressure. Was concerned that recent med changes caused this. Symptoms resolved by the time EDP saw patient. Hemoglobin slightly lower, renal function slightly better. Troponins stable. CXR and BNP improved.   Admitted 08/16/24 with increased shortness of breath and pedal edema. Lab results showed creatinine of 2.81, BNP 4199. X-ray showed a small pleural effusion, left lower lobe opacity concerning for pneumonia.  Procalcitonin level less than 0.1. IV diuresed with improvement of symptoms. Cardiology consulted due to an episode of PSVT and was started on amiodarone .    SUBJECTIVE:  He presents today for a HF follow-up visit. Reports overall feeling great. Denies SHOB, chest pain, papiltations, edema, dizziness, and sleep difficulty. Uses pulse O2 at home HR jumps from 70s-120s, believes it is due to the  malfunction of the device. Reports baseline HR around 80s. Endorses no worsening symptoms during this time.   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 NAS but has decreased appetite. Has not smoked cigarettes since this June. Denies alcohol or drug use.   Endorses he is taking all medications as prescribed aside from Lasix . Is taking 40 mg instead of 60 mg with no complication. He discussed a cancer shot he was to receive per Dr. Marcelino. Per note he was referred to hematology for anemia with the plan of Epogen or IV iron. States he will follow up with Dr. Marcelino today.   PMH, current medications, allergies, social history, and family history reviewed in epic.  ROS: All systems negative except what is listed in HPI, PMH and Problem List   PHYSICAL EXAM:  General: Well appearing. No acute signs of distress.  Cor: No JVD. Regular rhythm, rate.  Lungs: Clear bilaterally. Symmetrical chest expansion. No cough.  Abdomen: Soft, nontender, nondistended. Extremities: No edema, cyanosis, or rashes.  Neuro: AO x3. Affect pleasant  Vitals:   10/13/24 1050 10/13/24 1057  BP: 117/65 (!) 107/50  Pulse: (!) 115 84  SpO2: 98%   Weight: 57.8 kg    Wt Readings from Last 3 Encounters:  10/13/24 57.8 kg  09/19/24 57 kg  08/19/24 55.2 kg   Lab Results  Component Value Date   CREATININE 2.92 (H) 08/19/2024   CREATININE 2.98 (H) 08/18/2024   CREATININE 2.87 (H) 08/17/2024    ASSESSMENT & PLAN:  1. Chronic systolic heart failure  - Diagnosed 6/25. Echo EF < 25% - No cath with CKD IV - Refused  cMRI - Stable/improved NYHA class II - weight up 3 pounds from last visit here 3 weeks ago.  - Continue carvedilol  12.5mg  BID - Continue furosemide  40mg  daily.  - Continue hydralazine  50mg  BID - Continue jardiance  10mg  daily.  - No MRA, RAASi, digoxin due to CKD IV - Recent labs with Nephrology - Not ICD canddiate  2. Hypertension: - BP 117/65, recheck  107/50 - BMET 09/18/24 reviewed: sodium 136, potassium 5.5, creatinine 2.5, GFR 27. - BMET today   3. CAD - Coronary calcium  noted on CT scan - No role for cath with CKD IV and no angina - Followed by General Cardiology.  - Continue atorvastatin  40mg  daily - LDL 05/21/24 was 94  4. CKD IV - Follows with Nephrology [name]), seen 10/25 - continue jardiance  10mg  daily - Hg 09/18/24 was 10  5. Tachypalpitations - HR 115, recheck 84 - Zio results reviewed from 09/21/24, EP referral placed  - continue amiodarone  200mg  daily - discussed need for yearly eye exam - TSH 06/09/24 was 4.810. - TSH today   6. Alcohol and tobacco abuse - No alcohol since admission 06/25 - No smoking since admission 06/25.  - Does continue to use nicotine  patches   Return in 3 months, sooner if needed.   I spent 33 minutes reviewing records, interviewing/ examing patient and managing plan/ orders.    Ellouise Class, FNP / Solomon Lorre Opdahl, FNP-S 10/13/24

## 2024-10-13 ENCOUNTER — Encounter: Payer: Self-pay | Admitting: Family

## 2024-10-13 ENCOUNTER — Ambulatory Visit: Attending: Family | Admitting: Family

## 2024-10-13 VITALS — BP 107/50 | HR 84 | Wt 127.5 lb

## 2024-10-13 DIAGNOSIS — I130 Hypertensive heart and chronic kidney disease with heart failure and stage 1 through stage 4 chronic kidney disease, or unspecified chronic kidney disease: Secondary | ICD-10-CM | POA: Insufficient documentation

## 2024-10-13 DIAGNOSIS — J90 Pleural effusion, not elsewhere classified: Secondary | ICD-10-CM | POA: Diagnosis not present

## 2024-10-13 DIAGNOSIS — M459 Ankylosing spondylitis of unspecified sites in spine: Secondary | ICD-10-CM | POA: Insufficient documentation

## 2024-10-13 DIAGNOSIS — I10 Essential (primary) hypertension: Secondary | ICD-10-CM

## 2024-10-13 DIAGNOSIS — Z87891 Personal history of nicotine dependence: Secondary | ICD-10-CM | POA: Diagnosis not present

## 2024-10-13 DIAGNOSIS — I2510 Atherosclerotic heart disease of native coronary artery without angina pectoris: Secondary | ICD-10-CM | POA: Insufficient documentation

## 2024-10-13 DIAGNOSIS — I5022 Chronic systolic (congestive) heart failure: Secondary | ICD-10-CM | POA: Insufficient documentation

## 2024-10-13 DIAGNOSIS — D631 Anemia in chronic kidney disease: Secondary | ICD-10-CM | POA: Diagnosis not present

## 2024-10-13 DIAGNOSIS — Z79899 Other long term (current) drug therapy: Secondary | ICD-10-CM | POA: Insufficient documentation

## 2024-10-13 DIAGNOSIS — I471 Supraventricular tachycardia, unspecified: Secondary | ICD-10-CM | POA: Insufficient documentation

## 2024-10-13 DIAGNOSIS — N184 Chronic kidney disease, stage 4 (severe): Secondary | ICD-10-CM | POA: Insufficient documentation

## 2024-10-13 DIAGNOSIS — Z720 Tobacco use: Secondary | ICD-10-CM

## 2024-10-13 DIAGNOSIS — I429 Cardiomyopathy, unspecified: Secondary | ICD-10-CM | POA: Diagnosis not present

## 2024-10-13 DIAGNOSIS — I5040 Unspecified combined systolic (congestive) and diastolic (congestive) heart failure: Secondary | ICD-10-CM | POA: Diagnosis not present

## 2024-10-13 NOTE — Patient Instructions (Signed)
 Medication Changes:  No medication changes today!  Lab Work:  Go downstairs to [facility] on [address] to have your blood work completed.  We will only call you if the results are abnormal or if the provider would like to make medication changes.  No news is good news.   Referrals:  You have been referred to [facility]rophysiology. They will contact you in order to schedule an appointment.    Follow-Up in: Please follow up with the Advanced Heart Failure Clinic in 3 months with Victor Class, FNP.   Thank you for choosing [facility].    At the Advanced [facility] Failure Clinic, you and your health needs are our priority. We have a designated team specialized in the treatment of Heart Failure. This Care Team includes your primary Heart Failure Specialized Cardiologist (physician), Advanced Practice Providers (APPs- Physician Assistants and Nurse Practitioners), and Pharmacist who all work together to provide you with the care you need, when you need it.   You may see any of the following providers on your designated Care Team at your next follow up:  Dr. Toribio Fuel Dr. Ezra Shuck Dr. Ria Commander Dr. Morene Brownie Victor Class, FNP Jaun Bash, RPH-CPP  Please be sure to bring in all your medications bottles to every appointment.   Need to Contact Us :  If you have any questions or concerns before your next appointment please send us  a message through [place] or call our office at [phone].    TO LEAVE A MESSAGE FOR THE NURSE SELECT OPTION 2, PLEASE LEAVE A MESSAGE INCLUDING: YOUR NAME DATE OF BIRTH CALL BACK NUMBER REASON FOR CALL**this is important as we prioritize the call backs  YOU WILL RECEIVE A CALL BACK THE SAME DAY AS LONG AS YOU CALL BEFORE 4:00 PM

## 2024-10-13 NOTE — Progress Notes (Signed)
 Pt states that he is pressed for time today. [facility]. Notified provider. Changed order for labs.

## 2024-11-27 ENCOUNTER — Ambulatory Visit: Attending: Internal Medicine

## 2024-11-27 DIAGNOSIS — I5022 Chronic systolic (congestive) heart failure: Secondary | ICD-10-CM | POA: Diagnosis not present

## 2024-11-27 LAB — ECHOCARDIOGRAM COMPLETE
AR max vel: 1.37 cm2
AV Area VTI: 1.47 cm2
AV Area mean vel: 1.33 cm2
AV Mean grad: 4 mmHg
AV Peak grad: 7 mmHg
Ao pk vel: 1.32 m/s
Area-P 1/2: 4.96 cm2
Calc EF: 20.8 %
S' Lateral: 5.2 cm
Single Plane A2C EF: 16 %
Single Plane A4C EF: 24.7 %

## 2024-11-27 MED ORDER — PERFLUTREN LIPID MICROSPHERE
1.0000 mL | INTRAVENOUS | Status: AC | PRN
  Administered 2024-11-27: 14:00:00 2 mL via INTRAVENOUS

## 2024-12-12 ENCOUNTER — Telehealth: Payer: Self-pay

## 2024-12-12 NOTE — Telephone Encounter (Signed)
 Callers name: self Relation to patient:   Call back phone #:   Pharmacy (if applicable):   Issue/reason for call: pt called stating that he is very short of breath and that his heart rate is in the 120's at rest. Pt states he has taken his morning medications including carvedilol  and amio. States that he hasn't felt well and experienced shortness of breath last night. Denies any swelling. States he doesn't feel like he is building fluid up. Weight stable at 127lbs. Bp today after medications is 135/85

## 2024-12-12 NOTE — Telephone Encounter (Signed)
 Called pt back. His heart rate is now down to 90bpm. Advised that if his heart rate goes above 120 and sustains, he needs to report to ER. Pt agreeable. Rescheduled appt for EP to sooner date per Ellouise Class, FNP. Pt agreeable to new appt time and ER instructions. Reviewed location of EP appt with pt. No further questions at this time.

## 2024-12-22 NOTE — Progress Notes (Unsigned)
 " Electrophysiology Office Note:   Date:  12/23/2024  ID:  Victor Willis, DOB 1956/10/30, MRN [id number]  Primary Cardiologist: Lonni Hanson, MD Electrophysiologist: Fonda Kitty, MD      History of Present Illness:   Victor Willis is a 69 y.o. male with h/o CKD IV, ankylosing spondylitis, hypertension, chronic systolic heart faliure who is being seen today for EP evaluation.  Discussed the use of AI scribe software for clinical note transcription with the patient, who gave verbal consent to proceed.  History of Present Illness Victor Willis is a 69 year old male with heart failure who presents with concerns about heart palpitations and medication management.  He has experienced heart palpitations since a young age, describing episodes of his heart 'racing' and 'fluttering.' These episodes are brief, lasting only seconds, and have not been frequent recently. He is currently on amiodarone , which has improved his symptoms.  He is on furosemide  for fluid management, taking 40 mg one day and 20 mg the next, which he finds effective.  He has a history of heart failure and has been under the care of heart failure specialists. He has made lifestyle changes, including stopping alcohol use, to improve his heart health.  He feels exhausted but does not mention any other specific symptoms. No new or acute complaints.   Review of systems complete and found to be negative unless listed in HPI.   EP Information / Studies Reviewed:    EKG is ordered today. Personal review as below.  EKG Interpretation Date/Time:  Tuesday December 23 2024 15:58:58 EST Ventricular Rate:  72 PR Interval:  174 QRS Duration:  110 QT Interval:  418 QTC Calculation: 457 R Axis:   -26  Text Interpretation: Normal sinus rhythm Left ventricular hypertrophy with repolarization abnormality ( R in aVL , Sokolow-Lyon , Cornell product ) When compared with ECG of 16-Aug-2024 17:35, Similar findings  Confirmed by Kitty Fonda [phone]) on 12/23/2024 9:17:37 PM   Zio 07/2024    Echo 11/27/24:  1. Left ventricular ejection fraction, by estimation, is 20 to 25%. Left  ventricular ejection fraction by 3D volume is 21 %. The left ventricle has  severely decreased function. The left ventricle demonstrates global  hypokinesis. The left ventricular  internal cavity size was moderately dilated. Left ventricular diastolic  parameters are consistent with Grade II diastolic dysfunction  (pseudonormalization). The average left ventricular global longitudinal  strain is -6.5 %. The global longitudinal strain   is abnormal.   2. Right ventricular systolic function is normal. The right ventricular  size is mildly enlarged. There is normal pulmonary artery systolic  pressure. The estimated right ventricular systolic pressure is 29.4 mmHg.   3. The mitral valve is normal in structure. Mild to moderate mitral valve  regurgitation. No evidence of mitral stenosis.   4. The aortic valve is normal in structure. Aortic valve regurgitation is  not visualized. No aortic stenosis is present.   5. The inferior vena cava is normal in size with greater than 50%  respiratory variability, suggesting right atrial pressure of 3 mmHg.   RHC 05/30/24:  HEMODYNAMICS: RA:                  2 mmHg (mean) RV:                  29/2 mmHg PA:                  32/13 mmHg (20 mean) PCWP:  16 mmHg (mean)                                      Estimated Fick CO/CI: 3.3 L/min, 2.2 L/min/m2  Thermodilution CO/CI: 4 L/min, 2.6 L/min/m2                                                  TPG                 4  mmHg                                              PVR                 ~1 Wood Units  PAPi                >5       IMPRESSION: Normal to low right and left heart filling pressures Normal cardiac index by TD; mild to moderately reduced by Fick  Normal PVR.    Physical Exam:   VS:  BP (!) 120/50 (BP Location:  Left Arm, Patient Position: Sitting, Cuff Size: Normal)   Pulse 72   Ht 5' (1.524 m)   Wt 125 lb 9.6 oz (57 kg)   SpO2 95%   BMI 24.53 kg/m    Wt Readings from Last 3 Encounters:  12/23/24 125 lb 9.6 oz (57 kg)  10/13/24 127 lb 8 oz (57.8 kg)  09/19/24 125 lb 9.6 oz (57 kg)     General: Well developed, in no acute distress.  Neck: No JVD.  Cardiac: Normal rate, regular rhythm.  Resp: Normal work of breathing.  Ext: Trace edema.  Neuro: No gross focal deficits.  Psych: Normal affect.    ASSESSMENT AND PLAN:   Assessment and Plan Assessment & Plan Chronic systolic heart failure: Etiology unclear, left heart cath was deferred due to CKD.  Patient previously refused MRI.  Well compensated on exam today. - Continue GDMT and regular follow-up with HF team. - Patient has persistently low LVEF less than 35% despite guideline directed medical therapy.  He is NYHA class II-III at baseline.  He has previously not felt to be an appropriate ICD candidate by the HF team.  He reports improved compliance with medications and denies alcohol and tobacco use since June 2025.  I will readdress with his HF providers as to whether they think he might be a more appropriate candidate for ICD therapy at this time.  Palpitations: Patient has reported tachypalpitations dating back to his initial heart failure visits.  The majority of his ECGs demonstrate sinus tachycardia, likely compensatory response in setting of decompensated heart failure.  Zio only demonstrated PSVT and NSVT.  No sustained arrhythmias. PSVT: NSVT: High risk medication use: Amiodarone . - Amiodarone  appears to have been effective in controlling his symptoms.  Given that he does not have an ICD and had NSVT on Zio, I feel amiodarone  benefits likely outweigh potential risks. - Continue carvedilol  12.5mg  BID.    Follow up with EP Team in 3 months  Signed, Fonda Kitty, MD  "

## 2024-12-23 ENCOUNTER — Ambulatory Visit: Attending: Cardiology | Admitting: Cardiology

## 2024-12-23 ENCOUNTER — Encounter: Payer: Self-pay | Admitting: Cardiology

## 2024-12-23 VITALS — BP 120/50 | HR 72 | Ht 60.0 in | Wt 125.6 lb

## 2024-12-23 DIAGNOSIS — I4729 Other ventricular tachycardia: Secondary | ICD-10-CM | POA: Diagnosis not present

## 2024-12-23 DIAGNOSIS — I5022 Chronic systolic (congestive) heart failure: Secondary | ICD-10-CM

## 2024-12-23 DIAGNOSIS — I471 Supraventricular tachycardia, unspecified: Secondary | ICD-10-CM

## 2024-12-23 DIAGNOSIS — Z79899 Other long term (current) drug therapy: Secondary | ICD-10-CM

## 2024-12-23 DIAGNOSIS — R002 Palpitations: Secondary | ICD-10-CM | POA: Diagnosis not present

## 2024-12-23 NOTE — Patient Instructions (Signed)
 Medication Instructions:  Your physician recommends that you continue on your current medications as directed. Please refer to the Current Medication list given to you today.  *If you need a refill on your cardiac medications before your next appointment, please call your pharmacy*  Follow-Up: At [facility], you and your health needs are our priority.  As part of our continuing mission to provide you with exceptional heart care, our providers are all part of one team.  This team includes your primary Cardiologist (physician) and Advanced Practice Providers or APPs (Physician Assistants and Nurse Practitioners) who all work together to provide you with the care you need, when you need it.  Your next appointment:   As needed with Dr. Kennyth    Please go get lab work requested by Heart Failure clinic when you are feeling better

## 2025-01-09 ENCOUNTER — Telehealth: Payer: Self-pay | Admitting: Family

## 2025-01-09 NOTE — Telephone Encounter (Signed)
 Called to confirm/remind patient of their appointment at the [facility] Failure [facility] on 01/12/25.   Appointment:   [] Confirmed  [x] Left mess   [] No answer/No voice mail  [] VM Full/unable to leave message  [] Phone not in service  Patient reminded to bring all medications and/or complete list.  Confirmed patient has transportation. Gave directions, instructed to utilize valet parking.

## 2025-01-12 ENCOUNTER — Ambulatory Visit: Admitting: Family

## 2025-01-23 ENCOUNTER — Ambulatory Visit: Admitting: Family

## 2025-02-17 ENCOUNTER — Ambulatory Visit: Admitting: Cardiology
